# Patient Record
Sex: Female | Born: 1975 | Race: White | Hispanic: No | Marital: Married | Smoking: Never smoker
Health system: Southern US, Community
[De-identification: ages and names within clinical notes are randomized; demographics above are authoritative.]

## PROBLEM LIST (undated history)

## (undated) DIAGNOSIS — E782 Mixed hyperlipidemia: Secondary | ICD-10-CM

## (undated) DIAGNOSIS — K769 Liver disease, unspecified: Secondary | ICD-10-CM

## (undated) DIAGNOSIS — K5730 Diverticulosis of large intestine without perforation or abscess without bleeding: Secondary | ICD-10-CM

## (undated) DIAGNOSIS — Z9889 Other specified postprocedural states: Secondary | ICD-10-CM

## (undated) DIAGNOSIS — N390 Urinary tract infection, site not specified: Secondary | ICD-10-CM

## (undated) DIAGNOSIS — R011 Cardiac murmur, unspecified: Secondary | ICD-10-CM

## (undated) DIAGNOSIS — E063 Autoimmune thyroiditis: Secondary | ICD-10-CM

## (undated) DIAGNOSIS — G43909 Migraine, unspecified, not intractable, without status migrainosus: Secondary | ICD-10-CM

## (undated) DIAGNOSIS — M1990 Unspecified osteoarthritis, unspecified site: Secondary | ICD-10-CM

## (undated) DIAGNOSIS — T8859XA Other complications of anesthesia, initial encounter: Secondary | ICD-10-CM

## (undated) DIAGNOSIS — R112 Nausea with vomiting, unspecified: Secondary | ICD-10-CM

## (undated) HISTORY — DX: Migraine, unspecified, not intractable, without status migrainosus: G43.909

## (undated) HISTORY — DX: Autoimmune thyroiditis: E06.3

## (undated) HISTORY — DX: Diverticulosis of large intestine without perforation or abscess without bleeding: K57.30

## (undated) HISTORY — PX: APPENDECTOMY: SHX54

## (undated) HISTORY — PX: ABDOMINAL HYSTERECTOMY: SHX81

## (undated) HISTORY — DX: Mixed hyperlipidemia: E78.2

## (undated) HISTORY — DX: Urinary tract infection, site not specified: N39.0

## (undated) HISTORY — DX: Liver disease, unspecified: K76.9

---

## 2001-07-30 ENCOUNTER — Emergency Department (HOSPITAL_COMMUNITY): Admission: EM | Admit: 2001-07-30 | Discharge: 2001-07-30 | Payer: Self-pay | Admitting: Emergency Medicine

## 2002-06-01 ENCOUNTER — Other Ambulatory Visit: Admission: RE | Admit: 2002-06-01 | Discharge: 2002-06-01 | Payer: Self-pay | Admitting: Obstetrics and Gynecology

## 2002-12-14 ENCOUNTER — Other Ambulatory Visit: Admission: RE | Admit: 2002-12-14 | Discharge: 2002-12-14 | Payer: Self-pay | Admitting: Obstetrics and Gynecology

## 2003-05-04 ENCOUNTER — Inpatient Hospital Stay (HOSPITAL_COMMUNITY): Admission: AD | Admit: 2003-05-04 | Discharge: 2003-05-04 | Payer: Self-pay | Admitting: Obstetrics and Gynecology

## 2003-08-01 ENCOUNTER — Inpatient Hospital Stay (HOSPITAL_COMMUNITY): Admission: AD | Admit: 2003-08-01 | Discharge: 2003-08-01 | Payer: Self-pay | Admitting: Obstetrics and Gynecology

## 2003-08-29 ENCOUNTER — Encounter (INDEPENDENT_AMBULATORY_CARE_PROVIDER_SITE_OTHER): Payer: Self-pay | Admitting: *Deleted

## 2003-08-29 ENCOUNTER — Inpatient Hospital Stay (HOSPITAL_COMMUNITY): Admission: AD | Admit: 2003-08-29 | Discharge: 2003-09-06 | Payer: Self-pay | Admitting: Obstetrics and Gynecology

## 2003-08-29 ENCOUNTER — Inpatient Hospital Stay (HOSPITAL_COMMUNITY): Admission: AD | Admit: 2003-08-29 | Discharge: 2003-08-29 | Payer: Self-pay | Admitting: Obstetrics and Gynecology

## 2003-08-31 ENCOUNTER — Encounter: Payer: Self-pay | Admitting: Obstetrics and Gynecology

## 2003-09-07 ENCOUNTER — Encounter: Admission: RE | Admit: 2003-09-07 | Discharge: 2003-10-07 | Payer: Self-pay | Admitting: Obstetrics and Gynecology

## 2003-10-12 ENCOUNTER — Other Ambulatory Visit: Admission: RE | Admit: 2003-10-12 | Discharge: 2003-10-12 | Payer: Self-pay | Admitting: Obstetrics and Gynecology

## 2004-04-03 ENCOUNTER — Other Ambulatory Visit: Admission: RE | Admit: 2004-04-03 | Discharge: 2004-04-03 | Payer: Self-pay | Admitting: Obstetrics and Gynecology

## 2005-08-13 ENCOUNTER — Other Ambulatory Visit: Admission: RE | Admit: 2005-08-13 | Discharge: 2005-08-13 | Payer: Self-pay | Admitting: Obstetrics and Gynecology

## 2005-10-17 ENCOUNTER — Ambulatory Visit (HOSPITAL_COMMUNITY): Admission: RE | Admit: 2005-10-17 | Discharge: 2005-10-17 | Payer: Self-pay | Admitting: Obstetrics and Gynecology

## 2005-10-17 ENCOUNTER — Encounter (INDEPENDENT_AMBULATORY_CARE_PROVIDER_SITE_OTHER): Payer: Self-pay | Admitting: *Deleted

## 2007-12-06 ENCOUNTER — Emergency Department (HOSPITAL_COMMUNITY): Admission: EM | Admit: 2007-12-06 | Discharge: 2007-12-06 | Payer: Self-pay | Admitting: Emergency Medicine

## 2008-06-16 ENCOUNTER — Ambulatory Visit (HOSPITAL_COMMUNITY): Admission: RE | Admit: 2008-06-16 | Discharge: 2008-06-17 | Payer: Self-pay | Admitting: Obstetrics and Gynecology

## 2008-06-16 ENCOUNTER — Encounter (INDEPENDENT_AMBULATORY_CARE_PROVIDER_SITE_OTHER): Payer: Self-pay | Admitting: Obstetrics and Gynecology

## 2008-06-24 ENCOUNTER — Encounter (INDEPENDENT_AMBULATORY_CARE_PROVIDER_SITE_OTHER): Payer: Self-pay | Admitting: General Surgery

## 2008-06-24 ENCOUNTER — Encounter: Payer: Self-pay | Admitting: Obstetrics and Gynecology

## 2008-06-24 ENCOUNTER — Inpatient Hospital Stay (HOSPITAL_COMMUNITY): Admission: EM | Admit: 2008-06-24 | Discharge: 2008-06-28 | Payer: Self-pay | Admitting: Emergency Medicine

## 2008-06-25 ENCOUNTER — Other Ambulatory Visit: Payer: Self-pay | Admitting: Obstetrics and Gynecology

## 2008-07-13 ENCOUNTER — Inpatient Hospital Stay (HOSPITAL_COMMUNITY): Admission: EM | Admit: 2008-07-13 | Discharge: 2008-07-15 | Payer: Self-pay | Admitting: Emergency Medicine

## 2008-07-21 ENCOUNTER — Encounter: Admission: RE | Admit: 2008-07-21 | Discharge: 2008-07-21 | Payer: Self-pay | Admitting: General Surgery

## 2008-07-22 ENCOUNTER — Inpatient Hospital Stay (HOSPITAL_COMMUNITY): Admission: AD | Admit: 2008-07-22 | Discharge: 2008-07-30 | Payer: Self-pay | Admitting: General Surgery

## 2008-08-20 ENCOUNTER — Encounter: Admission: RE | Admit: 2008-08-20 | Discharge: 2008-08-20 | Payer: Self-pay | Admitting: General Surgery

## 2009-09-14 ENCOUNTER — Encounter: Admission: RE | Admit: 2009-09-14 | Discharge: 2009-09-14 | Payer: Self-pay | Admitting: Gastroenterology

## 2009-10-02 ENCOUNTER — Encounter: Admission: RE | Admit: 2009-10-02 | Discharge: 2009-10-02 | Payer: Self-pay | Admitting: Gastroenterology

## 2009-11-07 ENCOUNTER — Ambulatory Visit (HOSPITAL_COMMUNITY): Admission: RE | Admit: 2009-11-07 | Discharge: 2009-11-07 | Payer: Self-pay | Admitting: Obstetrics and Gynecology

## 2009-11-21 ENCOUNTER — Inpatient Hospital Stay (HOSPITAL_COMMUNITY): Admission: AD | Admit: 2009-11-21 | Discharge: 2009-11-21 | Payer: Self-pay | Admitting: Obstetrics and Gynecology

## 2009-12-27 ENCOUNTER — Ambulatory Visit (HOSPITAL_COMMUNITY): Admission: RE | Admit: 2009-12-27 | Discharge: 2009-12-27 | Payer: Self-pay | Admitting: Gastroenterology

## 2010-05-20 ENCOUNTER — Encounter: Payer: Self-pay | Admitting: General Surgery

## 2010-07-12 ENCOUNTER — Other Ambulatory Visit (HOSPITAL_COMMUNITY): Payer: Self-pay | Admitting: Anesthesiology

## 2010-07-12 ENCOUNTER — Encounter (HOSPITAL_COMMUNITY)
Admission: RE | Admit: 2010-07-12 | Discharge: 2010-07-12 | Disposition: A | Discharge status: Home / Self Care | Payer: 59 | Source: Ambulatory Visit | Attending: Obstetrics and Gynecology | Admitting: Obstetrics and Gynecology

## 2010-07-12 DIAGNOSIS — R102 Pelvic and perineal pain: Secondary | ICD-10-CM

## 2010-07-12 LAB — CBC
HCT: 41.3 % (ref 36.0–46.0)
MCH: 25.2 pg — ABNORMAL LOW (ref 26.0–34.0)
MCHC: 31.7 g/dL (ref 30.0–36.0)
RDW: 14.7 % (ref 11.5–15.5)

## 2010-07-12 LAB — SURGICAL PCR SCREEN: MRSA, PCR: NEGATIVE

## 2010-07-13 ENCOUNTER — Other Ambulatory Visit (HOSPITAL_COMMUNITY): Payer: Self-pay

## 2010-07-14 LAB — COMPREHENSIVE METABOLIC PANEL
BUN: 9 mg/dL (ref 6–23)
CO2: 24 mEq/L (ref 19–32)
Calcium: 9.3 mg/dL (ref 8.4–10.5)
Creatinine, Ser: 0.8 mg/dL (ref 0.4–1.2)
GFR calc non Af Amer: >60 mL/min (ref >60)
Glucose, Bld: 95 mg/dL (ref 70–99)
Sodium: 137 mEq/L (ref 135–145)
Total Protein: 7.2 g/dL (ref 6.0–8.3)

## 2010-07-14 LAB — CBC
HCT: 39.6 % (ref 36.0–46.0)
Hemoglobin: 13.3 g/dL (ref 12.0–15.0)
MCH: 26.4 pg (ref 26.0–34.0)
MCHC: 33.7 g/dL (ref 30.0–36.0)
MCV: 78.5 fL (ref 78.0–100.0)
RDW: 15.9 % — ABNORMAL HIGH (ref 11.5–15.5)

## 2010-07-15 LAB — CBC
HCT: 39.6 % (ref 36.0–46.0)
Hemoglobin: 13.5 g/dL (ref 12.0–15.0)
MCV: 77.6 fL — ABNORMAL LOW (ref 78.0–100.0)
RBC: 5.11 MIL/uL (ref 3.87–5.11)
RDW: 17.2 % — ABNORMAL HIGH (ref 11.5–15.5)
WBC: 6.5 10*3/uL (ref 4.0–10.5)

## 2010-07-15 LAB — SURGICAL PCR SCREEN: MRSA, PCR: NEGATIVE

## 2010-07-19 ENCOUNTER — Ambulatory Visit (HOSPITAL_COMMUNITY)
Admission: RE | Admit: 2010-07-19 | Discharge: 2010-07-19 | Disposition: A | Discharge status: Home / Self Care | Payer: 59 | Source: Ambulatory Visit | Attending: Anesthesiology | Admitting: Anesthesiology

## 2010-07-19 ENCOUNTER — Other Ambulatory Visit (HOSPITAL_COMMUNITY): Payer: Self-pay | Admitting: Anesthesiology

## 2010-07-19 DIAGNOSIS — R102 Pelvic and perineal pain: Secondary | ICD-10-CM

## 2010-07-19 DIAGNOSIS — Z01812 Encounter for preprocedural laboratory examination: Secondary | ICD-10-CM | POA: Insufficient documentation

## 2010-07-19 DIAGNOSIS — N949 Unspecified condition associated with female genital organs and menstrual cycle: Secondary | ICD-10-CM | POA: Insufficient documentation

## 2010-07-20 ENCOUNTER — Ambulatory Visit (HOSPITAL_COMMUNITY)
Admission: RE | Admit: 2010-07-20 | Discharge: 2010-07-20 | Disposition: A | Discharge status: Home / Self Care | Payer: 59 | Source: Ambulatory Visit | Attending: Obstetrics and Gynecology | Admitting: Obstetrics and Gynecology

## 2010-07-20 ENCOUNTER — Other Ambulatory Visit: Payer: Self-pay | Admitting: Obstetrics and Gynecology

## 2010-07-20 DIAGNOSIS — N949 Unspecified condition associated with female genital organs and menstrual cycle: Secondary | ICD-10-CM | POA: Insufficient documentation

## 2010-07-20 DIAGNOSIS — R1032 Left lower quadrant pain: Secondary | ICD-10-CM | POA: Insufficient documentation

## 2010-07-20 DIAGNOSIS — Z01818 Encounter for other preprocedural examination: Secondary | ICD-10-CM | POA: Insufficient documentation

## 2010-07-20 DIAGNOSIS — Z90710 Acquired absence of both cervix and uterus: Secondary | ICD-10-CM | POA: Insufficient documentation

## 2010-07-20 DIAGNOSIS — Z01812 Encounter for preprocedural laboratory examination: Secondary | ICD-10-CM | POA: Insufficient documentation

## 2010-07-20 LAB — COMPREHENSIVE METABOLIC PANEL
ALT: 19 U/L (ref 0–35)
AST: 19 U/L (ref 0–37)
Albumin: 3.9 g/dL (ref 3.5–5.2)
CO2: 23 mEq/L (ref 19–32)
Chloride: 106 mEq/L (ref 96–112)
Creatinine, Ser: 0.87 mg/dL (ref 0.4–1.2)
GFR calc Af Amer: >60 mL/min (ref >60)
GFR calc non Af Amer: >60 mL/min (ref >60)
Sodium: 136 mEq/L (ref 135–145)
Total Bilirubin: 0.6 mg/dL (ref 0.3–1.2)

## 2010-07-29 NOTE — Op Note (Addendum)
NAMEOliver Stout                 ACCOUNT NO.:  [id number]  MEDICAL RECORD NO.:  [id number]           PATIENT TYPE:  O  LOCATION:  XRAY                         FACILITY:  [facility]  PHYSICIAN:  Dineen Kid. Rana Snare, M.D.    DATE OF BIRTH:  02-24-1976  DATE OF PROCEDURE:  07/19/2010 DATE OF DISCHARGE:  07/19/2010                              OPERATIVE REPORT   PREOPERATIVE DIAGNOSES:  Pelvic pain, right lower quadrant pain, history pelvic adhesions.  POSTOPERATIVE DIAGNOSES:  Pelvic pain, right lower quadrant pain, history pelvic adhesions.  PROCEDURE:  Laparoscopic right salpingo-oophorectomy with lysis of adhesions.  SURGEON:  Dineen Kid. Rana Snare, MD  ANESTHESIA:  General endotracheal.  INDICATIONS:  [name] is a 35 year old with history of pelvic adhesions, pelvic pain, and has had hysterectomy in the past and has also had laparoscopic surgery for this with some improvement of symptoms.  They have recurred and worsened to point that it is incapacitating for her, requiring Vicodin to maintain adequate level pain control.  She does have a recurrent right ovarian cyst based on clinical symptoms and also ultrasound.  She desires surgical intervention and requested removal of the right tube and ovary with preservation of the left ovary.  She desires evaluation and treatment of pelvic adhesions.  The risks and benefits of the procedures were discussed at length which included but not limited to risk of infection, bleeding, damage to bowel, bladder, ureters, ovary, possibly it may not alleviate the pain, it could recur or worsen.  Risks associated with anesthesia blood transfusion.  She does give her informed consent and wished to proceed.  FINDINGS AT THE TIME OF SURGERY:  Normal-appearing liver, right ovary did have a simple appearing ovarian cyst, approximately 3-4 cm in size, left ovary was normal in appearance without evidence of adhesions or cyst.  There were adhesions from the  omentum and bowel to the cul-de-sac at the uterosacral ligament into the right pelvic sidewall from the descending colon, otherwise normal-appearing bladder and ureters.  The appendix was surgically absent.  DESCRIPTION OF PROCEDURE:  After adequate analgesia, the patient was placed in the dorsal lithotomy position.  She was sterilely prepped and draped.  The bladder was sterilely drained.  A sponge stick was placed intravaginal.  A 1-cm infraumbilical skin incision was made.  A Veress needle was inserted.  The abdomen was insufflated with dullness to percussion 11 mm trocar was inserted.  The above findings were noted by laparoscope.  A 5-mm trocar was inserted to the left of the midline two fingerbreadths above the pubic symphysis after careful and systematic evaluation of the abdomen and pelvis.  The right ovary was rasped, gyrus cutting forceps was used to coagulate and dissect across the infundibulopelvic ligament, removing the right tube and ovary from the pelvic sidewall with good hemostasis achieved.  The care was taken to avoid the underlying ureter.  The right ovary was morcellated and removed through the umbilical incision.  Evaluation of the pelvis filmy adhesions from the bowel to vaginal cuff to the descending colon were easily removed using the cutting forceps, descending colon adhesions to the right  pelvic wall were placed on counter traction and slowly dissected using cutting forceps.  Gyrus care taken to avoid the underlying bowel, underlying ureter, and achieved good hemostasis. After careful and meticulous dissection down the pelvic sidewall removing the epiploica from the sidewall, good hemostasis has been achieved.  No residual adhesions had been noted with normal-appearing anatomy restored.  Reevaluation of the ureters revealed good peristalsis, good hemostasis, and no evidence of any injury to the bowel or bladder.  After copious amount of irrigation, adequate  hemostasis was assured.  The Interceed adhesion barrier was placed in the cul-de-sac along the right pelvic sidewall and across the infundibulopelvic ligament.  The trocars were removed.  The abdomen was desufflated.  The infraumbilical skin incision was closed with a 0 Vicryl interrupted suture in the fascia, 3-0 Vicryl Rapide subcuticular suture, 5-mm site was closed with 3-0 Vicryl Rapide subcuticular suture and Dermabond adhesion barrier, incision was injected with 0.25% Marcaine a total of 10 mL used.  Sponge pack was removed from the vagina.  The patient was transferred to recovery room in a stable condition.  Sponge and instrument count was normal x3.  Estimated blood loss was minimal.  The patient received gentamicin and clindamycin preoperatively.  We will keep her in an extended care, and since she does have a history of postoperative slow recovery with the potential of keeping her overnight also we remove the PICC line which she had inserted yesterday before discharge home if she is discharged home from the recovery room or extended care, we will have her follow up in the next 2 to 3 weeks in the office and a prescription for Vicodin #30.  Told to return for increased pain, fever, or bleeding.     Dineen Kid Rana Snare, M.D.     DCL/MEDQ  D:  07/20/2010  T:  07/21/2010  Job:  [id number]  Electronically Signed by Candice Camp M.D. on 07/29/2010 11:31:34 AM

## 2010-08-08 LAB — CBC
HCT: 30.7 % — ABNORMAL LOW (ref 36.0–46.0)
Hemoglobin: 10.2 g/dL — ABNORMAL LOW (ref 12.0–15.0)
MCHC: 33.3 g/dL (ref 30.0–36.0)
Platelets: 338 10*3/uL (ref 150–400)
RDW: 16.4 % — ABNORMAL HIGH (ref 11.5–15.5)

## 2010-08-09 LAB — CLOSTRIDIUM DIFFICILE EIA
C difficile Toxins A+B, EIA: NEGATIVE
C difficile Toxins A+B, EIA: NEGATIVE

## 2010-08-09 LAB — ANAEROBIC CULTURE

## 2010-08-09 LAB — COMPREHENSIVE METABOLIC PANEL
ALT: 15 U/L (ref 0–35)
Alkaline Phosphatase: 69 U/L (ref 39–117)
BUN: 10 mg/dL (ref 6–23)
BUN: 7 mg/dL (ref 6–23)
CO2: 23 mEq/L (ref 19–32)
Calcium: 9 mg/dL (ref 8.4–10.5)
Chloride: 108 mEq/L (ref 96–112)
Chloride: 104 mEq/L (ref 96–112)
Creatinine, Ser: 0.86 mg/dL (ref 0.4–1.2)
GFR calc non Af Amer: >60 mL/min (ref >60)
Glucose, Bld: 90 mg/dL (ref 70–99)
Potassium: 4.3 mEq/L (ref 3.5–5.1)
Sodium: 138 mEq/L (ref 135–145)
Total Bilirubin: 0.5 mg/dL (ref 0.3–1.2)
Total Bilirubin: 0.6 mg/dL (ref 0.3–1.2)

## 2010-08-09 LAB — URINALYSIS, ROUTINE W REFLEX MICROSCOPIC
Glucose, UA: NEGATIVE mg/dL
Hgb urine dipstick: NEGATIVE
Nitrite: NEGATIVE
Protein, ur: NEGATIVE mg/dL
Specific Gravity, Urine: 1.025 (ref 1.005–1.030)
Urobilinogen, UA: 0.2 mg/dL (ref 0.0–1.0)
pH: 5.5 (ref 5.0–8.0)

## 2010-08-09 LAB — MAGNESIUM
Magnesium: 1.9 mg/dL (ref 1.5–2.5)
Magnesium: 1.9 mg/dL (ref 1.5–2.5)
Magnesium: 2 mg/dL (ref 1.5–2.5)

## 2010-08-09 LAB — CBC
HCT: 34.1 % — ABNORMAL LOW (ref 36.0–46.0)
Hemoglobin: 10.2 g/dL — ABNORMAL LOW (ref 12.0–15.0)
MCHC: 33.2 g/dL (ref 30.0–36.0)
MCHC: 33.3 g/dL (ref 30.0–36.0)
MCHC: 33.4 g/dL (ref 30.0–36.0)
MCV: 74.5 fL — ABNORMAL LOW (ref 78.0–100.0)
MCV: 73.2 fL — ABNORMAL LOW (ref 78.0–100.0)
MCV: 73.5 fL — ABNORMAL LOW (ref 78.0–100.0)
Platelets: 186 10*3/uL (ref 150–400)
Platelets: 209 10*3/uL (ref 150–400)
Platelets: ADEQUATE 10*3/uL (ref 150–400)
RBC: 4.22 MIL/uL (ref 3.87–5.11)
RDW: 15.7 % — ABNORMAL HIGH (ref 11.5–15.5)
RDW: 15.9 % — ABNORMAL HIGH (ref 11.5–15.5)
RDW: 15.9 % — ABNORMAL HIGH (ref 11.5–15.5)
WBC: 11.1 10*3/uL — ABNORMAL HIGH (ref 4.0–10.5)
WBC: 9.7 10*3/uL (ref 4.0–10.5)

## 2010-08-09 LAB — URINE CULTURE

## 2010-08-09 LAB — BASIC METABOLIC PANEL
BUN: 11 mg/dL (ref 6–23)
CO2: 27 mEq/L (ref 19–32)
Calcium: 8.5 mg/dL (ref 8.4–10.5)
Calcium: 8.9 mg/dL (ref 8.4–10.5)
Creatinine, Ser: 0.63 mg/dL (ref 0.4–1.2)
Creatinine, Ser: 0.87 mg/dL (ref 0.4–1.2)
GFR calc non Af Amer: >60 mL/min (ref >60)
Glucose, Bld: 101 mg/dL — ABNORMAL HIGH (ref 70–99)

## 2010-08-09 LAB — URINE MICROSCOPIC-ADD ON

## 2010-08-09 LAB — DIFFERENTIAL
Basophils Absolute: 0 10*3/uL (ref 0.0–0.1)
Eosinophils Absolute: 0.2 10*3/uL (ref 0.0–0.7)
Eosinophils Relative: 3 % (ref 0–5)
Eosinophils Relative: 1 % (ref 0–5)
Lymphocytes Relative: 25 % (ref 12–46)
Lymphocytes Relative: 10 % — ABNORMAL LOW (ref 12–46)
Lymphs Abs: 1.5 10*3/uL (ref 0.7–4.0)
Monocytes Absolute: 0.3 10*3/uL (ref 0.1–1.0)
Monocytes Relative: 6 % (ref 3–12)
Monocytes Relative: 3 % (ref 3–12)

## 2010-08-09 LAB — CULTURE, ROUTINE-ABSCESS: Culture: NO GROWTH

## 2010-08-09 LAB — LACTIC ACID, PLASMA: Lactic Acid, Venous: 1.2 mmol/L (ref 0.5–2.2)

## 2010-08-09 LAB — PHOSPHORUS
Phosphorus: 3.1 mg/dL (ref 2.3–4.6)
Phosphorus: 3.7 mg/dL (ref 2.3–4.6)
Phosphorus: 4.1 mg/dL (ref 2.3–4.6)

## 2010-08-09 LAB — LIPASE, BLOOD: Lipase: 50 U/L (ref 11–59)

## 2010-08-14 LAB — DIFFERENTIAL
Basophils Absolute: 0.1 10*3/uL (ref 0.0–0.1)
Basophils Absolute: 0 10*3/uL (ref 0.0–0.1)
Basophils Relative: 1 % (ref 0–1)
Basophils Relative: 0 % (ref 0–1)
Monocytes Absolute: 0.5 10*3/uL (ref 0.1–1.0)
Monocytes Relative: 8 % (ref 3–12)
Neutro Abs: 8.2 10*3/uL — ABNORMAL HIGH (ref 1.7–7.7)
Neutro Abs: 3.8 10*3/uL (ref 1.7–7.7)
Neutrophils Relative %: 84 % — ABNORMAL HIGH (ref 43–77)
Neutrophils Relative %: 62 % (ref 43–77)

## 2010-08-14 LAB — PREGNANCY, URINE: Preg Test, Ur: NEGATIVE

## 2010-08-14 LAB — URINALYSIS, ROUTINE W REFLEX MICROSCOPIC
Glucose, UA: NEGATIVE mg/dL
Hgb urine dipstick: NEGATIVE
Specific Gravity, Urine: 1.005 (ref 1.005–1.030)
pH: 6 (ref 5.0–8.0)

## 2010-08-14 LAB — COMPREHENSIVE METABOLIC PANEL
ALT: 20 U/L (ref 0–35)
AST: 22 U/L (ref 0–37)
CO2: 24 mEq/L (ref 19–32)
Calcium: 9.3 mg/dL (ref 8.4–10.5)
Chloride: 104 mEq/L (ref 96–112)
Creatinine, Ser: 0.77 mg/dL (ref 0.4–1.2)
GFR calc Af Amer: >60 mL/min (ref >60)
GFR calc non Af Amer: >60 mL/min (ref >60)
Glucose, Bld: 87 mg/dL (ref 70–99)
Total Bilirubin: 0.7 mg/dL (ref 0.3–1.2)

## 2010-08-14 LAB — POCT I-STAT, CHEM 8
Chloride: 104 mEq/L (ref 96–112)
Glucose, Bld: 93 mg/dL (ref 70–99)
HCT: 29 % — ABNORMAL LOW (ref 36.0–46.0)
Potassium: 3.7 mEq/L (ref 3.5–5.1)

## 2010-08-14 LAB — URINE CULTURE
Colony Count: NO GROWTH
Culture: NO GROWTH

## 2010-08-14 LAB — CBC
Hemoglobin: 9.2 g/dL — ABNORMAL LOW (ref 12.0–15.0)
Hemoglobin: 9.9 g/dL — ABNORMAL LOW (ref 12.0–15.0)
MCHC: 33.3 g/dL (ref 30.0–36.0)
MCHC: 34.1 g/dL (ref 30.0–36.0)
MCHC: 33 g/dL (ref 30.0–36.0)
Platelets: 331 10*3/uL (ref 150–400)
RBC: 3.82 MIL/uL — ABNORMAL LOW (ref 3.87–5.11)
RBC: 4.53 MIL/uL (ref 3.87–5.11)
RBC: 3.82 MIL/uL — ABNORMAL LOW (ref 3.87–5.11)
RDW: 14.5 % (ref 11.5–15.5)
RDW: 15.1 % (ref 11.5–15.5)
WBC: 8.1 10*3/uL (ref 4.0–10.5)

## 2010-08-14 LAB — GENTAMICIN LEVEL, RANDOM: Gentamicin Rm: 1.8 ug/mL

## 2010-09-11 NOTE — Discharge Summary (Signed)
NAMEOliver Barre                 ACCOUNT NO.:  [id number]   MEDICAL RECORD NO.:  [id number]          PATIENT TYPE:  INP   LOCATION:  9309                          FACILITY:  [facility]   PHYSICIAN:  Dineen Kid. Rana Snare, M.D.    DATE OF BIRTH:  29-Jul-1975   DATE OF ADMISSION:  06/25/2008  DATE OF DISCHARGE:  06/28/2008                               DISCHARGE SUMMARY   HISTORY OF PRESENT ILLNESS:  [name] 1 week  status post laparoscopic-assisted vaginal hysterectomy by Dr. Candice Camp  for fibroids and dysfunctional bleeding.  The procedure was  uncomplicated, and the patient had an unremarkable course after the  hysterectomy.  Until 1 week later, the patient began having fevers and  pain in the right lower quadrant, presented to the emergency room  underwent a CAT scan showing an inflamed appendix.  The patient was  taken to the operating room by Dr. Almond Lint, general surgeon, for a  laparoscopic appendectomy with diagnostic laparoscopy.  Dr. Richarda Overlie was called for intraoperative consult for evaluation of the cul-  de-sac and cuff.  He apparently felt that the cuff was clear with only  old clot and no actual bleeding, and the tubes and ovaries looked  normal.  Cultures had been obtained, but per Dr. Donell Beers and Dr. Marcelle Overlie  they agreed to admit to Dr. Dennie Bible service for postoperative  antibiotics for presumed cuff cellulitis following appendectomy.   HOSPITAL COURSE:  The patient's postoperative care was relatively  unremarkable.  She did have good pain control, remained afebrile, and  had good return of bowel function.  Her only fever was the first 24  hours postoperatively.  By postoperative day #3, after continuing on IV  antibiotics which were clindamycin __________ tolerating regular diet,  ambulating without difficulty, had a postoperative hemoglobin 9.2 with a  white count which was normal at 9.8.  The patient was discharged and we  had switched  her to oral antibiotics which included Flagyl and  ciprofloxacin.  The incisions were clean, dry, and intact.  There was  normoactive bowel sounds.   DISPOSITION:  The patient was discharged home to follow up in the office  in 1 week.  Continue on Flagyl 500 mg p.o. t.i.d., ciprofloxacin 500 mg  p.o. b.i.d.  Return for increased pain, fever, or bleeding.  She also  has prescription for oxycodone taken as needed.       Dineen Kid Rana Snare, M.D.  Electronically Signed     DCL/MEDQ  D:  08/08/2008  T:  08/08/2008  Job:  [id number]

## 2010-09-11 NOTE — Discharge Summary (Signed)
NAMEOliver Stout                 ACCOUNT NO.:  [id number]   MEDICAL RECORD NO.:  [id number]          PATIENT TYPE:  OIB   LOCATION:  9310                          FACILITY:  [facility]   PHYSICIAN:  Dineen Kid. Rana Snare, M.D.    DATE OF BIRTH:  [birth year]   DATE OF ADMISSION:  06/16/2008  DATE OF DISCHARGE:  06/17/2008                               DISCHARGE SUMMARY   HISTORY OF PRESENT ILLNESS:  [name] is a 35 year old with worsening  pelvic pain, abnormal bleeding fibroids, and recurrent endometrial  polyps.  She desires definitive surgical intervention, requests  hysterectomy, has no further childbearing desires, presents for this.   HOSPITAL COURSE:  The patient underwent a laparoscopically-assisted  vaginal hysterectomy with lysis of adhesions from omental adhesions.  The findings were enlarged uterus with fibroids.  The surgery was  uncomplicated with estimated blood loss of 300 mL.  Postoperative care  was unremarkable with good return of ambulation and bowel sounds.  By  postop day #1, she was tolerating a regular diet, ambulating without  difficulty, and have regular active bowel sounds.  Incision was clean,  dry, and intact.  Her postoperative hemoglobin was 9.9 and desires  discharge home.   DISPOSITION:  The patient to be discharged home and will follow up in  the office in 2-3 weeks.  Given the routine instruction sheet for  hysterectomy.  Told to return for increased pain, fever, or bleeding.  She is on light activity.  She has a prescription for Tylox #30 and  Motrin as needed.      Dineen Kid Rana Snare, M.D.  Electronically Signed     DCL/MEDQ  D:  06/17/2008  T:  06/17/2008  Job:  [id number]

## 2010-09-11 NOTE — H&P (Signed)
NAMEOliver Stout                 ACCOUNT NO.:  [id number]   MEDICAL RECORD NO.:  [id number]          PATIENT TYPE:  INP   LOCATION:  5149                         FACILITY:  [facility]   PHYSICIAN:  Almond Lint, MD       DATE OF BIRTH:  11-08-1975   DATE OF ADMISSION:  07/22/2008  DATE OF DISCHARGE:                              HISTORY & PHYSICAL   CHIEF COMPLAINT:  Abdominal pain.   HISTORY OF PRESENT ILLNESS:  [name] is an unfortunate 35 year old  female who underwent laparoscopic vaginal hysterectomy back in February  of 2010 by Dr. Dennie Bible practice.  Approximately 2 weeks later for  continued unresolved symptoms, she underwent CT scan, which was  concerning for appendicitis.  She had a laparoscopic appendectomy in  June 24, 2008, with resulting mildly inflamed appendicitis and what  appeared to be vaginal cuff cellulitis and weird infection of the right  salpinx.  She underwent IV antibiotic treatment for several days, and  then was discharged home on oral antibiotics.  She re-presented several  days after stopping her antibiotics with a pelvic abscess.  Percutaneous  drain was placed and she presented yesterday for CT scan.  At that  point, her fluid collections were appearing improved with 3 collections  all communicating with each other and with the drain.  She awoke this  morning at 4 a.m. with horrible abdominal pain.  She has been having a  little diarrhea yesterday following the CT scan contrast, but today  started having crampy diarrhea.  She also had nausea and vomiting and  dry heaving.  She denies fevers and chills.   PAST MEDICAL HISTORY:  Positive for migraines.   PAST SURGICAL HISTORY:  C-section in 2005, D&C in 2007, hysterectomy,  and appendectomy as described above.   MEDICATIONS:  Propoxyphene, Flagyl, Percocet, Phenergan, and Zomig.   ALLERGIES:  PENICILLIN.   FAMILY HISTORY:  Father has diabetes, heart disease, high blood  pressure, and CVA.   Mother has multiple sclerosis.   SOCIAL HISTORY:  She is unemployed, accompanied by her husband.  She  does not smoke and drinks approximately 3 alcoholic beverages a month.   REVIEW OF SYSTEMS:  Otherwise, negative except for the HPI x11 systems.   PHYSICAL EXAMINATION:  GENERAL:  She is alert and oriented, in obvious  distress.  VITAL SIGNS:  Temperature was 98.1, pulse 100, blood pressure 118/80,  respiratory rate 20.  HEENT:  Normocephalic, atraumatic.  HEART:  Regular.  ABDOMEN:  Soft and nondistended with diffuse tenderness across the lower  abdomen.  EXTREMITIES:  Warm and well perfused.   CT scan images and report were reviewed and described above.   ASSESSMENT:  [name] around a month out from  an appendectomy and around 2 weeks out from percutaneous drain placement  for abscess.  We will admit her to the hospital with IV antibiotics,  fluid resuscitation, and labs.  We will rule out Clostridium difficile  given her oddly appearing diarrhea and crampy abdominal pain.  Also, we  will obtain CT scan to evaluate  for superior mesenteric vein thrombosis.  If that is the case, we will need to anticoagulate her.  I placed her on  tigecycline given her allergy to penicillin.      Almond Lint, MD  Electronically Signed     FB/MEDQ  D:  07/22/2008  T:  07/23/2008  Job:  [id number]

## 2010-09-11 NOTE — Op Note (Signed)
NAMEOliver Stout                 ACCOUNT NO.:  [id number]   MEDICAL RECORD NO.:  [id number]          PATIENT TYPE:  INP   LOCATION:  5149                         FACILITY:  [facility]   PHYSICIAN:  Danise Edge, M.D.   DATE OF BIRTH:  Jul 09, 1975   DATE OF PROCEDURE:  07/26/2008  DATE OF DISCHARGE:                               OPERATIVE REPORT   PROCEDURE:  Diagnostic flexible proctosigmoidoscopy.   HISTORY:  [name], admitted to [facility] to evaluate and treat intermittent sharp generalized  abdominal pain associated with bowel urgency and small-volume nonbloody  diarrhea.   On June 24, 2008, the patient underwent a laparoscopic appendectomy.  On July 14, 2008, she required placement of drainage catheters into a  right lower quadrant abdominal abscess radiologically.  The patient has  been on Cipro and Flagyl since around July 14, 2008.  On July 22, 2008, MRI of the abdomen showed enlarging abdominal fluid collections in  the right lower quadrant despite drainage catheters.   C. diff toxin screen for stool on 2 occasions have been negative.   MEDICATIONS ALLERGIES:  PENICILLIN.   CURRENT MEDICATION:  Cipro, Flagyl, Tylenol, lorazepam, oxycodone, and  Imitrex.   PAST MEDICAL HISTORY:  1. June 16, 2008, laparoscopic-assisted vaginal hysterectomy with      adhesion lysis.  2. June 24, 2008, laparoscopic appendectomy with diagnostic      laparoscopy.  3. Migraine headache syndrome.  4. D and C in 2007.  5. Cesarean section in 2000.  6. July 14, 2008, right lower quadrant abdominal abscess, drained      percutaneously by Interventional Radiology.   HABITS:  The patient does not smoke cigarettes and consumes alcohol in  moderation.   ENDOSCOPIST:  Danise Edge, MD   PREMEDICATIONS:  1. Fentanyl 50 mcg.  2. Versed 10 mg.   PROCEDURE:  After obtaining informed consent, Ms. Church was placed in  the left lateral  decubitus position.  I administered intravenous  fentanyl and intravenous Versed to achieve conscious sedation for the  procedure.  The patient's blood pressure, oxygen saturation, and cardiac  rhythm were monitored throughout the procedure and documented in the  medical record.   Anal inspection and digital rectal exam were normal.  The Pentax  gastroscope was introduced into the rectum and advanced to approximately  55 cm from the anal verge without difficulty.  The patient received a  Fleet Enema prep prior to the exam, and I got a good look at the distal  colonic mucosa.   Endoscopic appearance of the rectum and distal left colon appeared  normal.  There was no endoscopic evidence for the presence of C.  difficile colitis or inflammatory bowel disease.   ASSESSMENT:  Normal flexible proctocolonoscopy to 55 cm from the anal  verge.   RECOMMENDATIONS:  Proceed with upper GI, small bowel follow-through x-  ray series looking for signs of inflammatory bowel disease.           ______________________________  Danise Edge, M.D.     MJ/MEDQ  D:  07/26/2008  T:  07/27/2008  Job:  [id number]

## 2010-09-11 NOTE — H&P (Signed)
NAMEOliver Stout                 ACCOUNT NO.:  [id number]   MEDICAL RECORD NO.:  [id number]          PATIENT TYPE:  OBV   LOCATION:  0098                         [facility]AC[facility]   PHYSICIAN:  Almond Lint, MD       DATE OF BIRTH:  01-May-1975   DATE OF ADMISSION:  06/24/2008  DATE OF DISCHARGE:  06/25/2008                              HISTORY & PHYSICAL   CHIEF COMPLAINT:  Abdominal pain.   HISTORY OF PRESENT ILLNESS:  [name] who  underwent a laparoscopic-assisted vaginal hysterectomy last week with  Dr. Rana Snare.  This was performed for dysfunctional uterine bleeding and  fibroids.  Since the surgery, she states that she has never really felt  well.  She has had continued pain and some nausea.  She went to the  physician on call 4 days ago and received oral antibiotics for fevers  and chills.  Despite that, she has continued to feel poorly and had  worsening of her abdominal pain over the last 24 hours with it landing  in the right lower quadrant.  She does not have much of an appetite.  She saw Dr. Marcelle Overlie today, and he performed a CT scan for her continued  pain, and they were concerned about appendicitis based on the scan.  There is no evidence of any abscess.  She currently is not having any  fevers today.   PAST MEDICAL HISTORY:  Significant for migraines.   PAST SURGICAL HISTORY:  Significant for the hysterectomy last Thursday,  a D and C and hysteroscopy back in 2007, a C-section in 2000.   SOCIAL HISTORY:  She is accompanied by her sister-in-law and her husband  who is a IT sales professional, and her sister-in-law is a Facilities manager at the OR  at [employer].  She does not smoke and uses alcohol approximately  3 times per month.   FAMILY HISTORY:  Noncontributory.   MEDICATIONS:  She is on Zomig, Percocet, ciprofloxacin and Motrin.   ALLERGIES:  She has tongue swelling and facial edema with PENICILLIN.   REVIEW OF SYSTEMS:  Otherwise negative  x11 systems.   PHYSICAL EXAMINATION:  VITAL SIGNS:  Temperature 98.2, pulse 109, blood  pressure 117/81, respiratory rate 16, and 99% on room air.  GENERAL:  Alert and oriented x3, in no acute distress.  MENTAL STATUS:  Mood and affect are normal.  SKIN:  The patient is slightly pale.  HEART:  Tachycardic but regular.  LUNGS:  Clear to auscultation bilaterally.  ABDOMEN:  Soft, nondistended, focally tender in the suprapubic and right  lower quadrant regions.  EXTREMITIES:  Warm and well perfused.  No pitting edema.   LABORATORIES:  White count of 6.1, hemoglobin and hematocrit 9.7 and  29.3 respectively, platelet count 350,000.  Chemistries:  Potassium is  3.7, creatinine 0.8.  CT scan from earlier today demonstrating  inflammatory changes in the pelvis with no focal abscesses.   ASSESSMENT:  [name] 1 week status post  laparoscopic-assisted vaginal hysterectomy with right lower quadrant  abdominal pain.  Given  the localization of her tenderness and the  suggestion of appendiceal thickening on CT scan, we will take her to the  operating room for diagnostic laparoscopy and appendectomy.  If anything  appears abnormal with regard to her hysterectomy, we will call Dr.  Marcelle Overlie who is on call tonight for Dr. Rana Snare.  Should there be an  abscess, we will drain it.  We will put her on IV antibiotics and keep  her n.p.o. to go to the operating room.      Almond Lint, MD  Electronically Signed     FB/MEDQ  D:  06/24/2008  T:  06/25/2008  Job:  [id number]

## 2010-09-11 NOTE — Op Note (Signed)
NAMEOliver Stout                 ACCOUNT NO.:  [id number]   MEDICAL RECORD NO.:  [id number]          PATIENT TYPE:  OIB   LOCATION:  9310                          FACILITY:  [facility]   PHYSICIAN:  Dineen Kid. Rana Snare, M.D.    DATE OF BIRTH:  1975-08-27   DATE OF PROCEDURE:  06/16/2008  DATE OF DISCHARGE:                               OPERATIVE REPORT   PREOPERATIVE DIAGNOSES:  1. Menorrhagia.  2. Fibroids.  3. Pelvic pain.  4. Dyspareunia.   POSTOPERATIVE DIAGNOSES:  1. Menorrhagia.  2. Fibroids.  3. Pelvic pain.  4. Dyspareunia.  5. Pelvic adhesions.   PROCEDURES:  Laparoscopic-assisted vaginal hysterectomy and lysis of  adhesions.   SURGEON:  Dineen Kid. Rana Snare, MD   ASSISTANT:  Stann Mainland. Vincente Poli, MD   ANESTHESIA:  General endotracheal.   INDICATIONS:  [name] is a 35 year old G1, P1.  No further  childbearing desires.  She has had abnormal uterine bleeding, fibroids,  recurrent endometrial polyps.  She also continued to have pelvic pain  and dyspareunia over the last number of years.  She desires definitive  surgical intervention and requests hysterectomy, presents for this.  Risks and benefits of the procedure were discussed at length, which  include, but not limited to risk of infection, bleeding, damage to  bowel, bladder, ureters, ovaries, possibly this may not alleviate the  pain, it could recur or worsen risks associated with blood transfusion,  and anesthesia were discussed.  Informed consent was obtained.   FINDINGS:  Findings at the time of the surgery were adhesions from the  omentum to anterior abdominal wall and also bladder to the uterus,  enlarged uterus with fibroids, normal-appearing ovaries, appendix, and  liver.   DESCRIPTION OF PROCEDURE:  After adequate analgesia, the patient was  placed in the dorsal lithotomy position.  She was sterilely prepped and  draped.  The bladder sterilely drained with Foley catheter.  A Hulka  tenaculum was placed on the  cervix.  A 1-cm infraumbilical skin incision  was made.  A Veress needle was inserted.  The abdomen was insufflated,  dullness to percussion, 11-mm trocar was inserted.  The above findings  were noted.  A 5-mm trocar was inserted to the left of the midline 2  fingerbreadths above pubic symphysis.  Gyrus cutting forceps was used to  grasp and coagulate and dissect the omentum from the anterior abdominal  wall with good hemostasis achieved.  The uterus was then elevated.  The  right utero-ovarian ligament was identified, grasped, ligated, and  dissected across the utero-ovarian ligaments.  The right fallopian tube  and also the right round ligament to the left round ligament was  identified, coagulated, and dissected.  This was done similarly across  the left utero-ovarian ligament down to the inferior portion of the  broad ligament.  Good hemostasis was achieved.  The bladder was then  elevated, small incision was made at the uterovesical junction freeing  up the bladder from the uterus.  The abdomen was then desufflated.  Legs  repositioned, weighted speculum placed in vagina, posterior colpotomy  was  performed.  Cervix was then circumscribed with Bovie cautery.  LigaSure instrument was used to ligate across the uterosacral ligaments  bilaterally, bladder pillars, and cardinal ligaments bilaterally  dissected with Mayo scissors.  The anterior vaginal mucosa was dissected  off the cervix.  The anterior peritoneum was entered sharply.  Deaver  retractor placed underneath the bladder.  The uterine vasculature was  then ligated with LigaSure and dissected with Mayo scissors.  The  inferior portion of the broad ligament was then ligated and dissected.  The fundus of the uterus was grasped with a thyroid tenaculum and  delivered through the introitus.  The uterosacral ligaments were  identified and grasped with Allis clamps.  Sutures of 0-Monocryl suture  placed in a figure-of-eight fashion.   The posterior vaginal mucosa was  then closed in purse-string fashion with 0-Monocryl suture.  Vagina was  then closed with figure-of-eights of 0-Monocryl suture in a vertical  fashion with good approximation, good hemostasis noted.  Foley catheter  was placed with returning clear yellow urine.  The legs repositioned,  abdomen reinsufflated.  A Dejot  suction irrigator was used to irrigate  the abdomen and pelvis.  Careful systematic evaluation of the pedicles  with bipolar used to touch-up small peritoneal bleeders and re-grasp  several small pedicles achieving good hemostasis and care taken to avoid  the underlying ureter and bowel after good hemostasis had been achieved  and copious amount of irrigation, adequate hemostasis was assured, the  abdomen was then desufflated, the trocars removed.  The infraumbilical  skin incision was closed with 0-Vicryl interrupted suture in the fascia,  3-0 Vicryl Rapide subcuticular suture.  The 5-mm site was closed with 3-  0 Vicryl Rapide subcuticular suture and Dermabond.  The incisions were  injected with 0.25% Marcaine, total 10 mL used.  The patient received  clindamycin 900 mg and Cipro 400 mg preoperatively.  The patient was  stable and transferred to recovery.  Instrument and sponge counts normal  x3.   ESTIMATED BLOOD LOSS:  300 mL.      Dineen Kid Rana Snare, M.D.  Electronically Signed     DCL/MEDQ  D:  06/16/2008  T:  06/16/2008  Job:  [id number]

## 2010-09-11 NOTE — Op Note (Signed)
NAMEOliver Stout                 ACCOUNT NO.:  [id number]   MEDICAL RECORD NO.:  [id number]          PATIENT TYPE:  OBV   LOCATION:  0098                         [facility]ACILITY[facility]  [facility]   PHYSICIAN:  Almond Lint, MD       DATE OF BIRTH:  [birth year]   DATE OF PROCEDURE:  06/24/2008  DATE OF DISCHARGE:  06/25/2008                               OPERATIVE REPORT   PREOPERATIVE DIAGNOSIS:  Appendicitis.   POSTOPERATIVE DIAGNOSES:  1. Appendicitis.  2. Infected vaginal cuff and stump of the bilateral fallopian tubes.   PROCEDURE:  Laparoscopic appendectomy and diagnostic laparoscopy.   SURGEON:  Almond Lint, MD.   Robin Stout. Robin Stout, M.D. for the diagnostic laparoscopy.   ANESTHESIA:  General and local.   FINDINGS:  Appendix secondarily inflamed lying on top of normal-  appearing ovary, but inflamed-appearing fallopian tube and necrotic-  appearing tube stump and vaginal cuff.   SPECIMEN:  Appendix to pathology.   ESTIMATED BLOOD LOSS:  Minimal.   COMPLICATIONS:  None known.   PROCEDURE:  [name] 1 week status post LAVH  for fibroids and dysfunctional uterine bleeding.  She presented with a  week of pain and not quite feeling well since her surgery.  She had  fevers and eventually ended up getting a CT scan showing an inflamed  appendix.  She was taken to the operating room and placed supine on the  operating room table.  Her Foley was placed and arms were tucked.  Her  abdomen was prepped and draped in a sterile fashion.  Time-out was  performed according to the surgical safety checklist.  When all was  correct, we continued.  The infraumbilical skin was anesthetized with  local anesthesia and the prior incision was opened.  The fascia was  entered at the site of her fascial incision.  A pursestring Vicryl  suture was placed around this and the Hasson trocar was introduced into  the abdomen.  The pneumoperitoneum was achieved to a pressure  of 15  mmHg.  The abdomen was examined with the camera and 2 other ports were  placed, one in the left lower quadrant and one in the right upper  quadrant.  This was done under direct visualization.  The omentum was  retracted from the pelvis and it was seen that the appendix was slightly  inflamed lying on top of an inflamed-appearing ovary and tube.  The  adjacent sigmoid also appeared to be slightly secondarily inflamed.  The  vaginal cuff and the proximal ends of the tubes appeared to be slightly  necrotic and inflamed.  They had a whitish appearance and were slightly  gangrenous.  The appendix was only mildly inflamed.  However, this was  removed by taking the mesoappendix with the Harmonic and coming across  the base of the appendix with the Endo-GIA.  The appendix was then  placed into the EndoCatch bag and removed from the umbilical incision.  The umbilical fascia was dilated with a Tresa Endo in order to achieve this.  Dr. Marcelle Stout then came in  and also worked with [place].  The appendiceal stump  staple line was examined for bleeding and none was seen.  He evaluated  the pelvis as well and felt that this was most likely a result of some  sort of mucosal pelvic infection, something similar to PID that was  causing this type of inflammation.  We irrigated the area and freed up  some of these adhesions.   The two 5 mm ports were removed with no bleeding seen.  The  pneumoperitoneum was allowed to evacuate and the Hasson was removed.  The pursestring suture was closed and the fascia was closed with a  suture.  The skin of all the incisions was then closed using 4-0  Monocryl in a subcuticular fashion.  Skin was cleaned, dried and dressed  with Dermabond.  The patient was awakened from anesthesia and taken to  the PACU in stable condition.      Almond Lint, MD  Electronically Signed     FB/MEDQ  D:  06/24/2008  T:  06/25/2008  Job:  [id number]

## 2010-09-11 NOTE — Consult Note (Signed)
NAMEOliver Barre                 ACCOUNT NO.:  [id number]   MEDICAL RECORD NO.:  [id number]         PATIENT TYPE:  WINP   LOCATION:                                FACILITY:  [facility]   PHYSICIAN:  [name] Marcelle Overlie, M.D.DATE OF BIRTH:  Jan 26, 1976   DATE OF CONSULTATION:  06/24/2008  DATE OF DISCHARGE:  06/25/2008                                 CONSULTATION   PREOPERATIVE CONSULTATION   PREOPERATIVE DIAGNOSIS:  Pelvic pain, rule out appendicitis.   POSTOPERATIVE DIAGNOSIS:  ____Same, PID.   INTRAOPERATIVE COMPLICATIONS:  None.   Patient is one week postoperative  _LAVH_, who has experienced some  fever and pelvic pain and was evaluated by abdominopelvic CT earlier  today with changes concerning for possible appendicitis.  Dr. Donell Beers  took her to the operating room for a laparoscopic appendectomy.  I am  requested for a consult due to pelvic inflammation.   On examination, there was a 3-port laparoscopy.  The appendix had  already been removed.  There was no evidence of frank pus.  There was  edema and erythema surrounding both tubes and ovaries, the cuff looked  relatively clear except for minimal old blood clot and no actual  bleeding.  The tissue on both sides, namely both tubes and ovaries,  looked viable.  At that point, we recommended postoperative antibiotics  treating for blood clot cellulitis/PID.  Cultures had been obtained.  Since she was PENICILLIN ALLERGIC, she was started on gentamicin and  Cleocin IV.      Richard M. Marcelle Overlie, M.D.  Electronically Signed     RMH/MEDQ  D:  06/24/2008  T:  06/25/2008  Job:  [id number]

## 2010-09-11 NOTE — H&P (Signed)
NAMEOliver Barre                 ACCOUNT NO.:  [id number]   MEDICAL RECORD NO.:  [id number]          PATIENT TYPE:  AMB   LOCATION:  SDC                           FACILITY:  [facility]   PHYSICIAN:  Dineen Kid. Rana Snare, M.D.    DATE OF BIRTH:  1975-04-30   DATE OF ADMISSION:  06/16/2008  DATE OF DISCHARGE:                              HISTORY & PHYSICAL   HISTORY OF PRESENT ILLNESS:  [name] is a 35 year old G1, P1 with  abnormal uterine bleeding, fibroids and recurrent endometrial polyps.  She has also had __________ pelvic pain and dyspareunia for the last  number of years.  She desires definitive surgical intervention, has no  further childbearing desires and desires hysterectomy.   PAST MEDICAL HISTORY:  Significant for history of migraines.   PAST SURGICAL HISTORY:  She has had a previous cesarean section and also  hysteroscopy and D and C for endometrial polyps.   MEDICATIONS:  Currently on NuvaRing.   ALLERGIES:  She has an allergy to PENICILLIN.   PHYSICAL EXAMINATION:  VITAL SIGNS:  Blood pressure is 124/92, weight  243 pounds.  HEART:  Regular rate and rhythm.  LUNGS:  Clear to auscultation bilaterally.  ABDOMEN:  Nondistended, nontender.  PELVIC:  The uterus is anteverted, mobile and nontender.   Saline infusion ultrasound shows several small fibroids, two intramural,  and several subserosal fibroids, largest 2.6 cm in size.  There are no  intracavitary masses.  There are multiple follicles in each ovary.   IMPRESSION AND PLAN:  Menorrhagia, fibroids, pelvic pain and dyspareunia  with no further childbearing desires.   PLAN:  Laparoscopic-assisted vaginal hysterectomy, possible total  abdominal hysterectomy.  Discussed the procedures at length which  included risks and benefits which include but are not limited to risk of  infection, bleeding, damage to the bowel, bladder, the ureters, the  ovaries.  Possibly, this may not alleviate the pain.  It could recur  worsening.   Risks associated with anesthesia, risk associated with blood  transfusion.  We discussed the possibility of having to convert to total  abdominal hysterectomy.  She does wish preservation of the ovaries.  She  does give her informed consent and wished to proceed.      Dineen Kid Rana Snare, M.D.  Electronically Signed    DCL/MEDQ  D:  06/15/2008  T:  06/15/2008  Job:  [id number]

## 2010-09-11 NOTE — H&P (Signed)
NAMEOliver Stout                 ACCOUNT NO.:  [id number]   MEDICAL RECORD NO.:  [id number]          PATIENT TYPE:  INP   LOCATION:  1524                         FACILITY:  [facility]   PHYSICIAN:  Sharlet Salina T. Hoxworth, M.D.DATE OF BIRTH:  06/24/1975   DATE OF ADMISSION:  07/13/2008  DATE OF DISCHARGE:                              HISTORY & PHYSICAL   CHIEF COMPLAINT:  Right-sided abdominal pain.   HISTORY OF PRESENT ILLNESS:  [name] who is  status post laparoscopic appendectomy by Dr. Donell Beers on June 24, 2008.  This was 1 week following a laparoscopic assisted vaginal  hysterectomy for dysfunctional uterine bleeding.  Description of the  operative findings questioned whether this was actually acute  appendicitis or secondarily inflamed due to infection or inflammatory  changes post hysterectomy.  Final pathology did reportedly show acute  appendicitis.  At any rate, she was hospitalized for about 3-4 days  after appendectomy then discharged home.  She states she was doing well  and gradually feeling better until about 4 days ago.  At that time, she  noticed the onset of constant aching right lower and right-sided  abdominal pain.  This pain has been gradually worsening and constant  ever since.  Has gotten to the point where it is not tolerable.  She saw  her gynecologist today and was sent to [facility] Emergency Room for  further evaluation.  CT scan was obtained as below.  The pain is  somewhat worse with motion.  She has not had any fever or chills.  She  has had a slight amount of vaginal discharge as she has had since her  hysterectomy but nothing unusual.  No urinary burning, frequency.  She  has had some mild nausea but no vomiting.   PAST MEDICAL HISTORY:  Other surgery includes C-section and  hysteroscopy.  She is treated for migraine headaches.   CURRENT MEDICATIONS:  1. Zomig.  2. Cipro.  3. Motrin.   ALLERGIES:  PENICILLIN.   SOCIAL HISTORY:  Married.  No cigarettes.  Occasional alcohol.   FAMILY HISTORY:  Noncontributory.   REVIEW OF SYSTEMS:  All generally unremarkable except as above.   PHYSICAL EXAM:  GENERAL:  Temperature is 98.2, heart rate is 101,  respirations 18, blood pressure 130/79, O2 sats 100.  GENERAL:  She is a mildly obese white female who appears uncomfortable  but in no distress.  SKIN:  Warm and dry.  No rash or infection.  HEENT: Sclera nonicteric.  Oropharynx clear.  LUNGS:  Clear without wheezing or increased work of breathing.  CARDIAC:  Regular with mild tachycardia.  No murmurs.  No edema.  ABDOMEN:  Well-healed laparoscopic incisions.  Abdomen is soft with some  moderate right lower quadrant and right mid lateral abdominal  tenderness.  There is no guarding, masses or peritoneal signs.  PELVIC: Exam not repeated.  EXTREMITIES: No joint swelling, deformity, edema.  NEUROLOGIC: Alert, fully oriented.   LABORATORY:  White count 6.2 without left shift.  Hemoglobin 10.6.  Electrolytes:  LFTs normal.  Urinalysis shows positive  leukocyte  esterase, too numerous to count white cells and too numerous to count  red cells.   CT scan of the abdomen and pelvis reviewed.  This shows a just over 4 cm  rounded fluid collection with a slightly enhancing rim in the right  lower quadrant, cannot rule out abscess.   ASSESSMENT/PLAN:  Abdominal pain 2 weeks status post laparoscopic  appendectomy and 3 weeks status post vaginal hysterectomy.  CT scan  indicates a possible early abscess.  She appeared to have a urinary  tract infection as well by urinalysis but no urinary symptoms.   The patient's pain is not tolerable at home.  She will be admitted.  Will obtain urine C and S.  Will switch her to IV Tygacil.  Interventional Radiology is be consulted regarding possible aspiration  or drainage of her right pelvic fluid collection.      Lorne Skeens. Hoxworth, M.D.  Electronically  Signed     BTH/MEDQ  D:  07/13/2008  T:  07/14/2008  Job:  [id number]

## 2010-09-11 NOTE — Consult Note (Signed)
NAMEOliver Stout                 ACCOUNT NO.:  [id number]   MEDICAL RECORD NO.:  [id number]          PATIENT TYPE:  INP   LOCATION:  5149                         FACILITY:  [facility]   PHYSICIAN:  Almond Lint, MD       DATE OF BIRTH:  [birth year]   DATE OF CONSULTATION:  07/29/2008  DATE OF DISCHARGE:                                 CONSULTATION   PREOPERATIVE DIAGNOSIS:  Pelvic abscesses.   DISCHARGE DIAGNOSIS:  Pelvic abscesses.   PROCEDURES PERFORMED:  Diagnostic laparoscopy and drainage of pelvic  abscess.   SURGEON:  Almond Lint, MD   ADDITIONAL SURGEON:  Zelphia Cairo, MD, GYN   FINDINGS:  Two fluid collections in the pelvis were opened up and  evacuated.  These were reasonably small.  The remainder of the pelvis  very sucked in with dense adhesions.   ESTIMATED BLOOD LOSS:  Minimal.   ASSESSMENT:  None.   PROCEDURE:  [name] was identified in the holding area and taken to  the operating room where she was placed supine on the operating room  table.  General anesthesia was induced.  Her left arm was tucked and she  was placed on lithotomy position.  The abdomen was prepped and draped in  a sterile fashion.  Time-out was performed according to the surgical  safety checklist.  When all was correct, we continued.  The skin was  anesthetized, this is at the left subcostal margin.  A 5 mm to Optiview  port was placed under direct visualization.  Pneumoperitoneum was  achieved to a pressure of 15 mmHg.  She had minimal adhesions to her  anterior abdominal wall and the patient was placed into Trendelenburg  and rotated to the left.  An additional port was placed in her prior  laparoscopy incision in the left lower quadrant and in the umbilicus.  These were used to facilitate dissection of the pelvis.  There were 2  fluid collections, one found at the right lateral abdominal wall, just  below the cecum, also one was just below left to midline.  The bowel was  carefully  retracted, however, much of that would not separate easily as  it was densely adherent to one another.  The vaginal cuff was seen and  appeared normal.  The left ovary was seen and appeared viable.  The  right ovary was not seen, however, the right pelvis was examined  carefully.  The anterior surface of the rectum was seen.  The cecum was  identified and the base of the cecum appeared normal as well as the  white line of Toldt.  The decision was made not to continue trying to  free up the loops of small bowel and the pelvis laparoscopically as the  edematous bowel was not amenable to retracting for further dissection.  The decision was made given that 2 fluid collections were identified and  evacuated that we would not proceed to trying to lift the small bowel  and attempt to open up all the loops.  This would definitely require an  open laparotomy to complete  safely.  We also decided not to continue  looking for the right ovary, it is approximately 30 minutes worse than  attempting to lyse these adhesions without effect.  Should she have  recurrence of her pain after drainage of the 2  fluid collection, we decided not to open at this date and continue  performing essentially negative exploratory laparotomy after having  positive diagnostic laparoscopy.  The patient was awoken from anesthesia  and taken to the PACU in stable condition.      Almond Lint, MD  Electronically Signed     FB/MEDQ  D:  07/29/2008  T:  07/30/2008  Job:  [id number]

## 2010-09-14 NOTE — Discharge Summary (Signed)
NAMEOliver Stout                             ACCOUNT NO.:  [id number]   MEDICAL RECORD NO.:  [id number]                   PATIENT TYPE:  INP   LOCATION:  9127                                 FACILITY:  [facility]   PHYSICIAN:  Guy Sandifer. Henderson Cloud, M.D.              DATE OF BIRTH:  1976/03/04   DATE OF ADMISSION:  08/29/2003  DATE OF DISCHARGE:  09/06/2003                                 DISCHARGE SUMMARY   ADMITTING DIAGNOSIS:  1. Intrauterine pregnancy at 68 weeks estimated gestational age.  2. Spontaneous onset of labor.   DISCHARGE DIAGNOSES:  1. Status post low transverse cesarean section secondary to fetal     intolerance of labor.  2. Viable female infant.   PROCEDURE:  Primary low transverse cesarean section.   REASON FOR ADMISSION:  Please see written H&P.   HOSPITAL COURSE:  The patient was a 35 year old primigravida that was  admitted to [facility] in early labor with contractions  noted to be every 2-3 minutes.  Artificial rupture of membranes was  performed revealing moderate meconium-stained fluid.  Intrauterine pressure  catheter was placed and also amnioinfusion was started.  Epidural was placed  for the patient's comfort.  After several hours of labor the fetus was noted  to have spontaneous prolonged deceleration for 5-8 minutes down to 80 beats  per minute.  Fetal heart tone did respond to position change, oxygen  administration, and IV fluids, and Pitocin was turned off at that time.  After a good recovery with fetal heart rate accelerations Pitocin was  restarted.  After approximately an hour more of labor another spontaneous  deceleration occurred which lasted approximately 5 minutes but again  responded to position change, oxygen administration, and IV fluids.  Because  of the prolonged deceleration pattern and fetal intolerance of labor  decision was made to proceed with a primary low transverse cesarean section.  The patient was then transferred  to the operating room where epidural was  dosed to an adequate surgical level.  A low transverse incision was made  with the delivery of a viable female infant weighing 7 pounds 5 ounces with  Apgars of 8 at one minute and 9 at five minutes.  Umbilical cord pH was  7.24.  The patient tolerated her procedure well and was taken to the  recovery room in stable condition.  The following day the patient was noted  to have a spike in her temperature to 103.4 and IV antibiotics were started.  On the following morning the patient did complain of a headache.  Lungs were  clear to auscultation.  Abdomen was soft, fundus was firm.  Abdominal  dressing had been removed revealing an incision that was clean, dry, and  intact.  Chest x-ray was performed which revealed some mild interstitial  edema.  The patient was also noted to have some sinus tachycardia in the  130s.  Labs revealed hemoglobin of 8.8; platelet count 175,000; wbc count of  7.9.  Blood gases were drawn which revealed pH of 7.51, PO2 of 84 on room  air.  The patient was started on some oxygen at 2 L per minute.  Oxygen  saturation at that point was 98%.  CT was also performed to rule out a  pulmonary embolus and Dr. Orvan Falconer, infectious disease physician, was also  contacted in reference to response to antibiotic therapy.  CT was negative.  However, heparin was started for coverage for possible septic pelvic  thrombosis.  Temperature continued to spike to 102.5, pulse was 104 to 124.  She remained on oxygen by nasal cannula with O2 saturations of 95-100.  Uterus was mildly tender.  Labs revealed hemoglobin of 8.9, wbc count of  9.3.  Chest x-ray was also repeated which was without significant change;  possible mild interstitial fluid was noted.  IV antibiotics were now changed  to Avelox and heparin was continued.  The patient was given Lasix 20 mg IV.  On postoperative day #4 the patient did feel better.  Temperature was now  down to 99.2  with a maximum of 100.5, blood pressure 140/70.  Fundus was  firm, moderately tender, with good return of bowel function.  Incision was  clean, dry, and intact.  Labs revealed hemoglobin of 8.7, wbc count of 9.4.  She continued on Avelox and heparin.  On postoperative day #5 the patient  was feeling better.  Vital signs were stable; she was now afebrile.  Fundus  was firm, less tender.  Incision was clean, dry, and intact.  She was  voiding without difficulty and ambulating well.  She remained on heparin and  Avelox.  The following morning the patient was afebrile.  Abdomen was soft.  Fundus was firm and nontender.  Incision was clean, dry, and intact.  She  continued on heparin and Avelox.  On the following morning the patient was  feeling well.  She was now afebrile for greater than 24 hours.  Incision was  clean, dry, and intact.  She was now transferred to the Mother-Baby Unit.  On the following a.m. vital signs were stable; she was afebrile.  Abdomen  was soft.  Fundus was firm and nontender.  Incision was clean, dry, and  intact and the patient was discharged home.   CONDITION ON DISCHARGE:  Stable.   DIET:  Regular as tolerated.   ACTIVITY:  No heavy lifting, no driving x2 weeks, no vaginal entry.   FOLLOW-UP:  The patient is to follow up in the office in 1 week for an  incision check.  She is to call for temperature greater than 100 degrees,  persistent nausea and vomiting, heavy vaginal bleeding, and/or redness or  drainage from the incisional site.   DISCHARGE MEDICATIONS:  1. Motrin 600 mg q.6h. p.r.n.  2. Tylox #30 one p.o. q.4-6h. p.r.n.  3. Prenatal vitamins one p.o. daily.  4. Colace one p.o. daily p.r.n.     Julio Sicks, N.P.                        Guy Sandifer. Henderson Cloud, M.D.    CC/MEDQ  D:  10/11/2003  T:  10/11/2003  Job:  [id number]

## 2010-09-14 NOTE — Op Note (Signed)
NAMEOliver Stout                             ACCOUNT NO.:  [id number]   MEDICAL RECORD NO.:  [id number]                   PATIENT TYPE:  INP   LOCATION:  9118                                 FACILITY:  [facility]   PHYSICIAN:  Dineen Kid. Rana Snare, M.D.                 DATE OF BIRTH:  [birth year]   DATE OF PROCEDURE:  08/29/2003  DATE OF DISCHARGE:                                 OPERATIVE REPORT   PREOPERATIVE DIAGNOSES:  1. Intrauterine pregnancy at 39 weeks.  2. Meconium.  3. Fetal intolerance to labor.   POSTOPERATIVE DIAGNOSES:  1. Intrauterine pregnancy at 39 weeks.  2. Meconium.  3. Fetal intolerance to labor.  4. Occiput posterior presentation.   PROCEDURE:  Primary low segment transverse cesarean section.   SURGEON:  Dineen Kid. Rana Snare, M.D.   ANESTHESIA:  Epidural.   INDICATIONS:  [name] is a 35 year old G1, who presented in early labor  with contractions every two to three minutes.  Amniotomy revealed moderate  meconium.  She had an internal pressure placed, and amnioperfusion was  performed.  After several hours of labor, the fetus had a spontaneous  prolonged deceleration for five to eight minutes down to 80 beats per  minute, which responded to position change, oxygen, and IV fluids and  turning Pitocin off.  After good recovery with fetal heart accelerations,  Pitocin was restarted and after approximately an hour or more of labor,  another spontaneous deceleration for approximately five minutes, which  responded again to position change and oxygen and IV fluids.  Because of the  prolonged decelerations and fetal intolerance to labor, the cervix was now 4  cm, completely effaced, and -2 station, we planned to proceed with primary  low segment transverse cesarean section.  The risks and benefits were  discussed, informed consent was obtained.   Findings at the time of surgery were a viable female infant, Apgars were 8 and  9, pH arterial 7.24.  Weight is 7 pounds 5  ounces.   DESCRIPTION OF PROCEDURE:  After adequate analgesia, the patient was placed  in the supine position with left lateral tilt.  She was sterilely prepped  and draped, the bladder was sterilely drained with a Foley catheter.  A  Pfannenstiel skin incision was made two fingerbreadths above the pubic  symphysis, taken down sharply to the fascia, which was incised transversely,  extended superiorly and inferiorly off the bellies of the rectus muscle,  which were separated sharply in the midline.  The peritoneum was entered  sharply, the bladder flap was created and placed behind the bladder blade.  A low segment myotomy incision was made down to the amniotic sac, extended  laterally with the operator's fingertips.  The infant's vertex were  delivered.  The nares and pharynx were deep-suctioned as well as the stomach  with DeLee suction.  The infant was then delivered, cord clamped  and cut,  and handed to the pediatricians for resuscitation.  Cord blood was then  obtained, placenta extracted manually.  The uterus was exteriorized, wiped  clean with a dry lap.  The myotomy incision was closed in two layers, the  first being a running locking layer, the second being an imbricating layer  of 0 Monocryl suture, with good approximation and good hemostasis.  Several  intramural fibroids were noted in the lower segment of the uterus, one  approximately 4 x 2 cm in the midline, and one approximately 2-3 cm in  diameter near the right broad ligament.  Normal fallopian tubes and ovaries  were also noted.  The uterus was placed back in the peritoneal cavity and  after a copious amount of irrigation, adequate hemostasis was assured.  The  peritoneum was closed with 0 Monocryl, the rectus muscle plicated in the  midline, and irrigation was applied and after adequate hemostasis, the  fascia was closed with #1 PDS in running fashion.  Irrigation was again  applied and after adequate hemostasis, the  Scarpa's and Camper's fascia was  closed with 2-0 plain suture in running fashion, skin was stapled, and Steri-  Strips applied.  The patient tolerated the procedure well and was stable on  transfer to the recovery room.  Sponge, needle, and instrument count was  normal x3.  Estimated blood loss during the procedure was 800 mL.  The  patient received 900 mg of Clindamycin after delivery of the placenta.                                               Dineen Kid Rana Snare, M.D.    DCL/MEDQ  D:  08/29/2003  T:  08/30/2003  Job:  [id number]

## 2010-09-14 NOTE — Op Note (Signed)
NAMEOliver Stout                 ACCOUNT NO.:  [id number]   MEDICAL RECORD NO.:  [id number]          PATIENT TYPE:  AMB   LOCATION:  SDC                           FACILITY:  [facility]   PHYSICIAN:  Dineen Kid. Rana Snare, M.D.    DATE OF BIRTH:  1975-05-02   DATE OF PROCEDURE:  10/17/2005  DATE OF DISCHARGE:                                 OPERATIVE REPORT   PREOPERATIVE DIAGNOSIS:  Abnormal uterine bleeding and endometrial mass with  saline infusion ultrasound.   POSTOPERATIVE DIAGNOSIS:  Abnormal uterine bleeding and endometrial mass  with saline infusion ultrasound plus endometrial polyp.   SURGEON:  Dineen Kid. Rana Snare, M.D.   ANESTHESIA:  Monitored anesthesia care plus local by paracervical block.   PROCEDURE:  Hysteroscopy dilatation and curettage with polypectomy.   INDICATIONS:  [name] is a 35 year old G1, P1 with excessive bleeding,  even on birth control pills.  A saline infusion ultrasound shows an  endometrial mass consistent with a polyp.  She presents today for  hysteroscopy, D&C for evaluation and removal of this.  The risks and  benefits of the procedure were discussed at length, and informed consent was  obtained.  Findings at the time of surgery were an endometrial polyp from  the posterior endometrial wall, otherwise normal-appearing ostia, cervix,  and fundus.   DESCRIPTION OF PROCEDURE:  After adequate analgesia, the patient was placed  in the dorsal lithotomy position.  She was sterilely prepped and draped.  The bladder was thoroughly drained.  A Graves speculum was placed.  The  tenaculum was placed in the anterior lip of the cervix.  Paracervical block  was placed with 1% Xylocaine and 1:100,000 epinephrine.  A total of 30 cc  used.  The uterus was sounded to 8 cm, easily dilated with a #25 Pratt  dilator.  The hysteroscope was inserted.  The above findings were noted.  Polyp forceps were used to grasp and remove the endometrial polyp, followed  by sharp curettage,  retrieving small fragments of the endometrial polyp and  also performing this until had a gritty surface on the endometrial cavity.  Examination of the hysteroscope revealed normal-appearing endometrial  cavity.  No residual polyps were noted.  A normal-appearing ostia and  cervix.  The hysteroscope was then removed.  The tenaculum removed from the  anterior lip of the cervix.  It was noted to be hemostatic.  The speculum  was then removed.  The patient was then transferred to the recovery room in  stable condition.  Sponge and instrument counts were normal x3.  Estimated  blood loss was minimal.  Sorbitol deficit 95 cc.  Patient did receive 400 mg  of Cipro preoperatively and 30 mg of Toradol postoperatively.   DISPOSITION:  The patient was discharged home with followup in the office in  2-3 weeks.  Told to return for increased pain, fever, or bleeding.  She was  sent home with a routine instructions sheet for D&C.     Dineen Kid Rana Snare, M.D.  Electronically Signed    DCL/MEDQ  D:  10/17/2005  T:  10/17/2005  Job:  [id number]

## 2010-09-14 NOTE — Discharge Summary (Signed)
NAMEOliver Stout                 ACCOUNT NO.:  [id number]   MEDICAL RECORD NO.:  [id number]          PATIENT TYPE:  INP   LOCATION:  5149                         FACILITY:  [facility]   PHYSICIAN:  Almond Lint, MD       DATE OF BIRTH:  [birth year]   DATE OF ADMISSION:  07/22/2008  DATE OF DISCHARGE:  07/30/2008                               DISCHARGE SUMMARY   PRINCIPAL DIAGNOSIS:  Abdominal pain.   SECONDARY DIAGNOSES:  1. Migraine.  2. Dysfunctional uterine bleeding.   DISCHARGE MEDICATIONS:  1. Doxycycline 100 mg p.o. b.i.d.  2. Zofran 4 mg q.6 h. p.r.n. nausea and vomiting.  3. Percocet 1-2 tabs q.4 h. p.r.n. pain.  4. Imitrex 100 mg p.r.n. migraine.   INSTRUCTIONS:  Do not bathe or swim for initial 2 weeks.  No lifting  over 10 pounds for 3 weeks.   HOSPITAL COURSE:  [name] was admitted to the hospital on July 22, 2008.  She was admitted with abdominal pain and suspicion for SMV  thrombosis or C. difficile.  These were evaluated, and were both  negative.  She received a repeat MRI after her pain did not resolve and  she continued to have a fluid collection in her abdomen and went to the  operating room on July 29, 2008, for drainage.  Two of three collections  were drained.  The third one required would require open laparotomy,  which she was against unless absolutely necessary.  Decision was made to  continue her antibiotics, and see if this resolved given the fact two  abscesses were drained already.  She was discharged home on July 30, 2008, in improved condition.  Her night sweats had resolved and she had  better pain control.  The patient will follow up with me in 1-2 weeks.      Almond Lint, MD  Electronically Signed     FB/MEDQ  D:  09/06/2008  T:  09/07/2008  Job:  [id number]

## 2011-01-10 ENCOUNTER — Emergency Department: Payer: Self-pay | Admitting: Emergency Medicine

## 2011-02-26 ENCOUNTER — Emergency Department (HOSPITAL_COMMUNITY)
Admission: EM | Admit: 2011-02-26 | Discharge: 2011-02-26 | Disposition: A | Discharge status: Home / Self Care | Payer: 59 | Attending: Emergency Medicine | Admitting: Emergency Medicine

## 2011-02-26 ENCOUNTER — Emergency Department (HOSPITAL_COMMUNITY): Payer: 59

## 2011-02-26 DIAGNOSIS — R51 Headache: Secondary | ICD-10-CM | POA: Insufficient documentation

## 2011-07-13 IMAGING — US IR FLUORO GUIDE CV LINE*L*
1 series · 2 of 2 positions shown · non-contrast
Comparison: none

CLINICAL DATA: Poor venous access.  The patient scheduled for
operative procedure.  Request has been made for PICC line
placement.

[Series 1: ir us guide vasc access*right* · 2 of 2 slices shown]
[im 1/2]
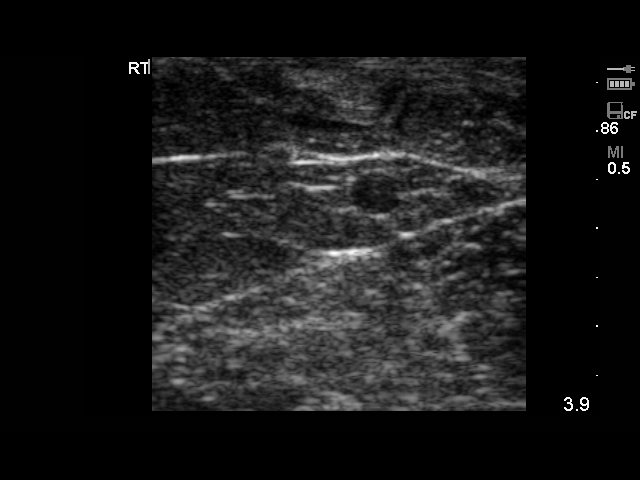
[im 2/2]
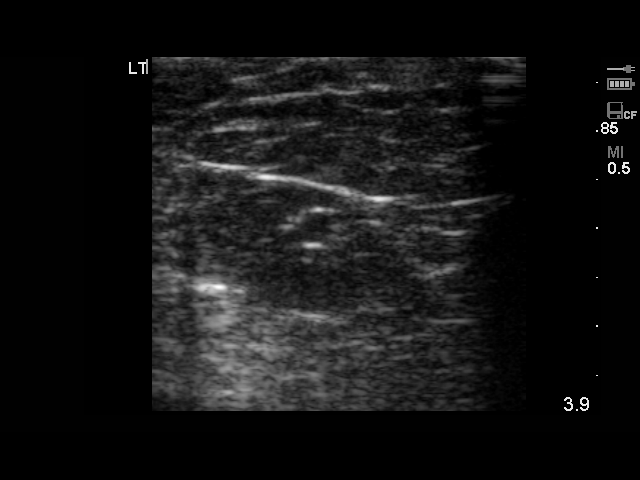

[2 of 2 positions shown; findings below may reference images not displayed]

PICC LINE PLACEMENT WITH ULTRASOUND AND FLUOROSCOPIC  GUIDANCE

Fluoroscopy Time: 0.7 minutes.

The right arm was prepped with chlorhexidine, draped in the usual
sterile fashion using maximum barrier technique (cap and mask,
sterile gown, sterile gloves, large sterile sheet, hand hygiene and
cutaneous antisepsis) and infiltrated locally with 1% Lidocaine.

Ultrasound demonstrated patency of the right cephalic vein, and
this was documented with an image.  Under real-time ultrasound
guidance, this vein was accessed with a 21 gauge micropuncture
needle and image documentation was performed.  The needle was
exchanged over a guidewire for a peel-away sheath through which a
five French dual lumen PICC trimmed to 41 cm was advanced,
positioned with its tip at the lower SVC/right atrial junction.
Fluoroscopy during the procedure and fluoro spot radiograph
confirms appropriate catheter position.  The catheter was flushed,
secured to the skin with Prolene sutures, and covered with a
sterile dressing.

Complications:  None immediate
IMPRESSION: Successful right arm PICC line placement with ultrasound and
fluoroscopic guidance.  The catheter is ready for use.

Read by: Re La, Irilda.-FORNY

## 2013-01-12 ENCOUNTER — Other Ambulatory Visit: Payer: Self-pay | Admitting: Obstetrics and Gynecology

## 2013-01-12 DIAGNOSIS — N6310 Unspecified lump in the right breast, unspecified quadrant: Secondary | ICD-10-CM

## 2013-01-14 ENCOUNTER — Ambulatory Visit
Admission: RE | Admit: 2013-01-14 | Discharge: 2013-01-14 | Disposition: A | Discharge status: Home / Self Care | Payer: PRIVATE HEALTH INSURANCE | Source: Ambulatory Visit | Attending: Obstetrics and Gynecology | Admitting: Obstetrics and Gynecology

## 2013-01-14 DIAGNOSIS — N6310 Unspecified lump in the right breast, unspecified quadrant: Secondary | ICD-10-CM

## 2013-01-22 ENCOUNTER — Encounter: Payer: Self-pay | Admitting: Family Medicine

## 2013-01-22 ENCOUNTER — Ambulatory Visit: Payer: Self-pay | Admitting: Family Medicine

## 2013-01-22 ENCOUNTER — Ambulatory Visit (INDEPENDENT_AMBULATORY_CARE_PROVIDER_SITE_OTHER): Payer: PRIVATE HEALTH INSURANCE | Admitting: Family Medicine

## 2013-01-22 VITALS — BP 110/90 | HR 76 | Temp 98.5°F | Resp 20 | Ht 62.0 in | Wt 265.0 lb

## 2013-01-22 DIAGNOSIS — IMO0002 Reserved for concepts with insufficient information to code with codable children: Secondary | ICD-10-CM | POA: Insufficient documentation

## 2013-01-22 DIAGNOSIS — G43709 Chronic migraine without aura, not intractable, without status migrainosus: Secondary | ICD-10-CM | POA: Insufficient documentation

## 2013-01-22 DIAGNOSIS — L659 Nonscarring hair loss, unspecified: Secondary | ICD-10-CM

## 2013-01-22 LAB — CBC WITH DIFFERENTIAL/PLATELET
Basophils Absolute: 0 10*3/uL (ref 0.0–0.1)
Eosinophils Absolute: 0.1 10*3/uL (ref 0.0–0.7)
Eosinophils Relative: 1 % (ref 0–5)
Lymphs Abs: 1.2 10*3/uL (ref 0.7–4.0)
MCH: 26.9 pg (ref 26.0–34.0)
MCV: 80.5 fL (ref 78.0–100.0)
Platelets: 223 10*3/uL (ref 150–400)
RDW: 15.2 % (ref 11.5–15.5)

## 2013-01-22 LAB — COMPLETE METABOLIC PANEL WITH GFR
ALT: 16 U/L (ref 0–35)
AST: 13 U/L (ref 0–37)
Albumin: 4.5 g/dL (ref 3.5–5.2)
Calcium: 9.5 mg/dL (ref 8.4–10.5)
Chloride: 105 mEq/L (ref 96–112)
Potassium: 4.2 mEq/L (ref 3.5–5.3)
Total Protein: 6.7 g/dL (ref 6.0–8.3)

## 2013-01-22 NOTE — Progress Notes (Signed)
  Subjective:    Patient ID: Robin Stout, female    DOB: [birth year], 37 y.o.   MRN: [id number]  HPI Patient is a 37 year old white female who comes in with gradual alopecia of the scalp. She has noticed thinning of her hair in the center of her scalp for several months. She is not experienced any thinning of her hair along the sides. She denies any fevers chills, serious illness, emotional stress, unusual rashes, muscle pains, joint pains, anxiety, or depression. She's had no recent changes in her medications. She's had no hospitalizations. Past Medical History  Diagnosis Date  . Migraines    No current outpatient prescriptions on file prior to visit.   No current facility-administered medications on file prior to visit.   Allergies  Allergen Reactions  . Penicillins Swelling   History   Social History  . Marital Status: Married    Spouse Name: N/A    Number of Children: N/A  . Years of Education: N/A   Occupational History  . Not on file.   Social History Main Topics  . Smoking status: Never Smoker   . Smokeless tobacco: Not on file  . Alcohol Use: Yes     Comment: occasional  . Drug Use: No  . Sexual Activity: Not on file   Other Topics Concern  . Not on file   Social History Narrative  . No narrative on file   No family history on file.    Review of Systems  All other systems reviewed and are negative.       Objective:   Physical Exam  Vitals reviewed. Constitutional: She appears well-developed and well-nourished.  Neck: Neck supple. No thyromegaly present.  Cardiovascular: Normal rate, regular rhythm, normal heart sounds and intact distal pulses.   No murmur heard. Pulmonary/Chest: Effort normal and breath sounds normal. No respiratory distress. She has no wheezes. She has no rales. She exhibits no tenderness.  Abdominal: Soft. Bowel sounds are normal. She exhibits no distension and no mass. There is no tenderness. There is no rebound and no guarding.   Musculoskeletal: She exhibits no edema.  Lymphadenopathy:    She has no cervical adenopathy.  Skin: No erythema.   There is thinning of the hair in an androgenic pattern in the center of her scalp.       Assessment & Plan:  1. 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 There is no evidence of scarring in the scalp or infection. It does not appear to be discord with this. I have recommended Rogaine for women over-the-counter. I will check a CBC a CMP and ANA and TSH and iron level to rule out metabolic causes of alopecia. - TSH - COMPLETE METABOLIC PANEL WITH GFR - CBC with Differential - ANA - Iron

## 2013-01-23 LAB — TSH: TSH: 0.845 u[IU]/mL (ref 0.350–4.500)

## 2013-01-26 ENCOUNTER — Other Ambulatory Visit: Payer: 59

## 2013-01-27 ENCOUNTER — Other Ambulatory Visit: Payer: 59

## 2013-02-03 ENCOUNTER — Telehealth: Payer: Self-pay | Admitting: Family Medicine

## 2013-02-03 NOTE — Telephone Encounter (Signed)
Patient needs to have her Zomig Rx refilled . [facility] has told her that they are DC this medicine and she needs something that will help her as well as this medicine.

## 2013-02-03 NOTE — Telephone Encounter (Signed)
Change med?

## 2013-02-04 MED ORDER — SUMATRIPTAN SUCCINATE 50 MG PO TABS: 50.0000 mg | ORAL_TABLET | ORAL | Status: DC | PRN

## 2013-02-04 NOTE — Telephone Encounter (Signed)
Switch to imitrex 50 mg

## 2013-02-04 NOTE — Telephone Encounter (Signed)
Med switched to Imitrex

## 2013-04-01 ENCOUNTER — Ambulatory Visit (INDEPENDENT_AMBULATORY_CARE_PROVIDER_SITE_OTHER): Payer: PRIVATE HEALTH INSURANCE | Admitting: Physician Assistant

## 2013-04-01 ENCOUNTER — Encounter: Payer: Self-pay | Admitting: Physician Assistant

## 2013-04-01 VITALS — BP 116/80 | HR 80 | Temp 98.4°F | Resp 18 | Wt 242.0 lb

## 2013-04-01 DIAGNOSIS — B029 Zoster without complications: Secondary | ICD-10-CM

## 2013-04-01 MED ORDER — VALACYCLOVIR HCL 1 G PO TABS: 1000.0000 mg | ORAL_TABLET | Freq: Three times a day (TID) | ORAL | Status: DC

## 2013-04-01 MED ORDER — GABAPENTIN 300 MG PO CAPS: ORAL_CAPSULE | ORAL | Status: DC

## 2013-04-01 NOTE — Progress Notes (Signed)
Patient ID: Robin Stout MRN: [id number], DOB: 02/03/1976, 37 y.o. Date of Encounter: 04/01/2013, 1:18 PM    Chief Complaint:  Chief Complaint  Patient presents with  . c/o poss shingles on back    discuss migraine meds     HPI: 37 y.o. year old white female reports that on Monday 03/29/13 she felt as if she had a virus. Felt extremely achy had some runny nose and some headache. Felt so bad  she went home early from work. On that day she felt a very small bump on her back and had her husband look at it but he said it was just a very minor area consistent with a little irritation. She says that the following day she felt much better regarding the viral type symptoms. Today she noticed a second area of rash on her back.   last night she started to notice a burning pain in her right upper back. She says that the pain is not severe and feels that it is mostly irritated because it is at site of her bra strap.     Home Meds: See attached medication section for any medications that were entered at today's visit. The computer does not put those onto this list.The following list is a list of meds entered prior to today's visit.   Current Outpatient Prescriptions on File Prior to Visit  Medication Sig Dispense Refill  . SUMAtriptan (IMITREX) 50 MG tablet Take 1 tablet (50 mg total) by mouth every 2 (two) hours as needed for migraine. May repeat in 2 hours if headache persists or recurs.  10 tablet  0  . zolmitriptan (ZOMIG) 5 MG tablet Take 5 mg by mouth as needed for migraine.       No current facility-administered medications on file prior to visit.    Allergies:  Allergies  Allergen Reactions  . Penicillins Swelling      Review of Systems: See HPI for pertinent ROS. All other ROS negative.    Physical Exam: Blood pressure 116/80, pulse 80, temperature 98.4 F (36.9 C), temperature source Oral, resp. rate 18, weight 242 lb (109.77 kg)., Body mass index is 44.25  kg/(m^2). General: Obese white female. Appears in no acute distress. Lungs: Clear bilaterally to auscultation without wheezes, rales, or rhonchi. Breathing is unlabored. Heart: Regular rhythm. No murmurs, rubs, or gallops. Msk:  Strength and tone normal for age. Skin: Right upper Back:  There is an 2 inch diameter area that is light pink erythema. Within this area, there is a 1/2 inch area of blister.   Inferior to the above lesion, there is a second area that is 2" x 1". This area has diffuse erythema and some blister. Neuro: Alert and oriented X 3. Moves all extremities spontaneously. Gait is normal. CNII-XII grossly in tact. Psych:  Responds to questions appropriately with a normal affect.     ASSESSMENT AND PLAN:  37 y.o. year old female with  1. Herpes zoster  - valACYclovir (VALTREX) 1000 MG tablet; Take 1 tablet (1,000 mg total) by mouth 3 (three) times daily. For 7 days  Dispense: 21 tablet; Refill: 0 - gabapentin (NEURONTIN) 300 MG capsule; Day 1: take 1 a day.               Day 2: Take one  twice a day.                  Day 3 and after: Take one 3 times a day.  Dispense: 90 capsule; Refill: 3  Follow up if pain not controlled with the above Neurontin. Told her to use the Neurontin for about 3 weeks then try to taper off of it. If  Pain is still present at that time can then return back on the Neurontin until the pain does resolve.  Signed, [address] [place], [place], [facility] 04/01/2013 1:18 PM

## 2013-04-30 ENCOUNTER — Telehealth: Payer: Self-pay | Admitting: *Deleted

## 2013-04-30 MED ORDER — RIZATRIPTAN BENZOATE 10 MG PO TABS: 10.0000 mg | ORAL_TABLET | ORAL | Status: DC | PRN

## 2013-04-30 NOTE — Telephone Encounter (Signed)
Patient aware and med sent to pharm 

## 2013-04-30 NOTE — Telephone Encounter (Signed)
Try maxalt 10 mg po prn migraine.  May repeat in 2 hrs if headache persists x 1.  If this does not work, I would like to see her to discuss preventative strategies.

## 2013-11-25 ENCOUNTER — Other Ambulatory Visit: Payer: Self-pay | Admitting: Family Medicine

## 2013-11-25 NOTE — Telephone Encounter (Signed)
Refill appropriate and filled per protocol. 

## 2014-04-07 ENCOUNTER — Encounter: Payer: Self-pay | Admitting: Family Medicine

## 2014-04-07 ENCOUNTER — Ambulatory Visit (INDEPENDENT_AMBULATORY_CARE_PROVIDER_SITE_OTHER): Payer: PRIVATE HEALTH INSURANCE | Admitting: Family Medicine

## 2014-04-07 VITALS — BP 100/62 | HR 60 | Temp 98.1°F | Resp 18 | Ht 61.0 in | Wt 150.0 lb

## 2014-04-07 DIAGNOSIS — G43009 Migraine without aura, not intractable, without status migrainosus: Secondary | ICD-10-CM

## 2014-04-07 MED ORDER — PROMETHAZINE HCL 25 MG PO TABS: 25.0000 mg | ORAL_TABLET | Freq: Three times a day (TID) | ORAL | Status: DC | PRN

## 2014-04-07 MED ORDER — RIZATRIPTAN BENZOATE 10 MG PO TBDP: 10.0000 mg | ORAL_TABLET | ORAL | Status: DC | PRN

## 2014-04-07 NOTE — Progress Notes (Signed)
   Subjective:    Patient ID: Robin Stout, female    DOB: 03-23-1976, 38 y.o.   MRN: [id number]  HPI  Patient has 3 severe migraines per month. She is currently taking Maxalt without substantial relief. The headaches are associated with photophobia, phonophobia. She also gets nausea with the headaches. Disintegrating tablet seemed to work better for her. She is overdue for cholesterol screen. Past Medical History  Diagnosis Date  . Migraines    No past surgical history on file. Current Outpatient Prescriptions on File Prior to Visit  Medication Sig Dispense Refill  . rizatriptan (MAXALT) 10 MG tablet TAKE 1 TABLET BY MOUTH AS NEEDED FOR MIGRAINE, MAY REPEAT IN 2 HOURS IF NEEDED 10 tablet 0   No current facility-administered medications on file prior to visit.   Allergies  Allergen Reactions  . Penicillins Swelling   History   Social History  . Marital Status: Married    Spouse Name: N/A    Number of Children: N/A  . Years of Education: N/A   Occupational History  . Not on file.   Social History Main Topics  . Smoking status: Never Smoker   . Smokeless tobacco: Not on file  . Alcohol Use: Yes     Comment: occasional  . Drug Use: No  . Sexual Activity: Not on file   Other Topics Concern  . Not on file   Social History Narrative     Review of Systems  All other systems reviewed and are negative.      Objective:   Physical Exam  Constitutional: She is oriented to person, place, and time.  Cardiovascular: Normal rate, regular rhythm, normal heart sounds and intact distal pulses.   Pulmonary/Chest: Effort normal and breath sounds normal. No respiratory distress. She has no wheezes. She has no rales. She exhibits no tenderness.  Neurological: She is alert and oriented to person, place, and time. She has normal reflexes. She displays normal reflexes. No cranial nerve deficit. She exhibits normal muscle tone. Coordination normal.  Vitals reviewed.           Assessment & Plan:  Nonintractable migraine, unspecified migraine type - Plan: promethazine (PHENERGAN) 25 MG tablet, rizatriptan (MAXALT-MLT) 10 MG disintegrating tablet  Switch the patient to Maxalt disintegrating tablet 10 mg 1 by mouth when necessary migraine area I'll also give the patient Phenergan 25 mg tablets to be taken every 6 hours as needed for migraine. Patient should return fasting for a CMP and fasting lipid panel.

## 2014-04-29 HISTORY — PX: ABDOMINOPLASTY: SUR9

## 2014-07-04 ENCOUNTER — Other Ambulatory Visit: Payer: Self-pay | Admitting: Family Medicine

## 2014-12-07 DIAGNOSIS — R634 Abnormal weight loss: Secondary | ICD-10-CM | POA: Insufficient documentation

## 2014-12-07 DIAGNOSIS — L574 Cutis laxa senilis: Secondary | ICD-10-CM | POA: Insufficient documentation

## 2015-01-06 ENCOUNTER — Encounter: Payer: Self-pay | Admitting: Family Medicine

## 2015-01-06 ENCOUNTER — Ambulatory Visit (INDEPENDENT_AMBULATORY_CARE_PROVIDER_SITE_OTHER): Payer: PRIVATE HEALTH INSURANCE | Admitting: Family Medicine

## 2015-01-06 VITALS — BP 110/70 | HR 68 | Temp 98.3°F | Resp 18 | Ht 62.0 in | Wt 145.0 lb

## 2015-01-06 DIAGNOSIS — H8112 Benign paroxysmal vertigo, left ear: Secondary | ICD-10-CM | POA: Diagnosis not present

## 2015-01-06 LAB — CBC WITH DIFFERENTIAL/PLATELET
Basophils Absolute: 0.1 10*3/uL (ref 0.0–0.1)
Basophils Relative: 1 % (ref 0–1)
Eosinophils Absolute: 0.1 10*3/uL (ref 0.0–0.7)
Eosinophils Relative: 1 % (ref 0–5)
HEMATOCRIT: 40.4 % (ref 36.0–46.0)
HEMOGLOBIN: 12.8 g/dL (ref 12.0–15.0)
LYMPHS PCT: 21 % (ref 12–46)
Lymphs Abs: 1.2 10*3/uL (ref 0.7–4.0)
MCH: 27.3 pg (ref 26.0–34.0)
MCHC: 31.7 g/dL (ref 30.0–36.0)
MCV: 86.1 fL (ref 78.0–100.0)
MONO ABS: 0.3 10*3/uL (ref 0.1–1.0)
MONOS PCT: 5 % (ref 3–12)
MPV: 11.8 fL (ref 8.6–12.4)
NEUTROS ABS: 4.2 10*3/uL (ref 1.7–7.7)
Neutrophils Relative %: 72 % (ref 43–77)
Platelets: 176 10*3/uL (ref 150–400)
RBC: 4.69 MIL/uL (ref 3.87–5.11)
RDW: 14.6 % (ref 11.5–15.5)
WBC: 5.8 10*3/uL (ref 4.0–10.5)

## 2015-01-06 NOTE — Progress Notes (Signed)
   Subjective:    Patient ID: Robin Stout, female    DOB: 08-18-1975, 39 y.o.   MRN: [id number]  HPI Patient has been having intermittent dizzy spells for the last 3-4 weeks. They are always associated with position changes or turning her head. They occur when she stands up, when she bends over, when she turns her head to the side rapidly. She denies the room spinning but she does feel a movement sensation and an "off balance" sensation.  On examination today she does have a positive Dix-Hallpike maneuver to the left. She denies any syncope. She denies any chest palpitations. She denies any chest pain. She denies any rapid sudden weight loss. She denies any  Obvious blood loss. He denies any hearing loss, tinnitus, headaches, sudden vision changes, or diplopia Past Medical History  Diagnosis Date  . Migraines    No past surgical history on file. Current Outpatient Prescriptions on File Prior to Visit  Medication Sig Dispense Refill  . promethazine (PHENERGAN) 25 MG tablet Take 1 tablet (25 mg total) by mouth every 8 (eight) hours as needed for nausea or vomiting. 20 tablet 0  . rizatriptan (MAXALT-MLT) 10 MG disintegrating tablet TAKE 1 TABLET BY MOUTH AS NEEDED FOR MIGRANE, MAY REPEAT IN 2 HOURS IF NEEDED 10 tablet 3   No current facility-administered medications on file prior to visit.   Allergies  Allergen Reactions  . Penicillins Swelling   Social History   Social History  . Marital Status: Married    Spouse Name: N/A  . Number of Children: N/A  . Years of Education: N/A   Occupational History  . Not on file.   Social History Main Topics  . Smoking status: Never Smoker   . Smokeless tobacco: Not on file  . Alcohol Use: Yes     Comment: occasional  . Drug Use: No  . Sexual Activity: Not on file   Other Topics Concern  . Not on file   Social History Narrative     Review of Systems  All other systems reviewed and are negative.      Objective:   Physical Exam    Constitutional: She is oriented to person, place, and time. She appears well-developed and well-nourished.  Cardiovascular: Normal rate, regular rhythm and normal heart sounds.   No murmur heard. Pulmonary/Chest: Effort normal and breath sounds normal. No respiratory distress. She has no wheezes. She has no rales.  Abdominal: Soft. Bowel sounds are normal. She exhibits no distension. There is no tenderness. There is no rebound and no guarding.  Neurological: She is alert and oriented to person, place, and time. She has normal reflexes. She displays normal reflexes. No cranial nerve deficit. She exhibits normal muscle tone. Coordination normal.  Vitals reviewed.         Assessment & Plan:  Benign paroxysmal positional vertigo, left - Plan: COMPLETE METABOLIC PANEL WITH GFR, CBC with Differential/Platelet  I suspect the patient is having benign paroxysmal positional vertigo. I recommended Epley maneuvers and tincture of time over the next 2 weeks. I will check a CBC to monitor for evidence of anemia. If symptoms persist or worsen, proceed with an MRI of the brain

## 2015-01-07 LAB — COMPLETE METABOLIC PANEL WITH GFR
ALBUMIN: 4.3 g/dL (ref 3.6–5.1)
ALT: 25 U/L (ref 6–29)
AST: 21 U/L (ref 10–30)
Alkaline Phosphatase: 44 U/L (ref 33–115)
BUN: 15 mg/dL (ref 7–25)
CHLORIDE: 104 mmol/L (ref 98–110)
CO2: 27 mmol/L (ref 20–31)
Calcium: 9.3 mg/dL (ref 8.6–10.2)
Creat: 0.77 mg/dL (ref 0.50–1.10)
GFR, Est African American: >89 mL/min (ref >=60)
GFR, Est Non African American: >89 mL/min (ref >=60)
GLUCOSE: 88 mg/dL (ref 70–99)
POTASSIUM: 4.1 mmol/L (ref 3.5–5.3)
SODIUM: 135 mmol/L (ref 135–146)
Total Bilirubin: 0.7 mg/dL (ref 0.2–1.2)
Total Protein: 6.2 g/dL (ref 6.1–8.1)

## 2015-01-10 ENCOUNTER — Encounter: Payer: Self-pay | Admitting: Family Medicine

## 2015-03-30 ENCOUNTER — Emergency Department (HOSPITAL_COMMUNITY): Payer: Worker's Compensation

## 2015-03-30 ENCOUNTER — Encounter (HOSPITAL_COMMUNITY): Payer: Self-pay | Admitting: *Deleted

## 2015-03-30 ENCOUNTER — Emergency Department (HOSPITAL_COMMUNITY)
Admission: EM | Admit: 2015-03-30 | Discharge: 2015-03-30 | Disposition: A | Discharge status: Home / Self Care | Payer: Worker's Compensation | Attending: Emergency Medicine | Admitting: Emergency Medicine

## 2015-03-30 DIAGNOSIS — W228XXA Striking against or struck by other objects, initial encounter: Secondary | ICD-10-CM | POA: Insufficient documentation

## 2015-03-30 DIAGNOSIS — Y9389 Activity, other specified: Secondary | ICD-10-CM | POA: Insufficient documentation

## 2015-03-30 DIAGNOSIS — Y998 Other external cause status: Secondary | ICD-10-CM | POA: Insufficient documentation

## 2015-03-30 DIAGNOSIS — S0990XA Unspecified injury of head, initial encounter: Secondary | ICD-10-CM | POA: Diagnosis not present

## 2015-03-30 DIAGNOSIS — Y92513 Shop (commercial) as the place of occurrence of the external cause: Secondary | ICD-10-CM | POA: Diagnosis not present

## 2015-03-30 DIAGNOSIS — G43909 Migraine, unspecified, not intractable, without status migrainosus: Secondary | ICD-10-CM | POA: Insufficient documentation

## 2015-03-30 DIAGNOSIS — Z880 Allergy status to penicillin: Secondary | ICD-10-CM | POA: Diagnosis not present

## 2015-03-30 HISTORY — PX: OTHER SURGICAL HISTORY: SHX169

## 2015-03-30 MED ORDER — ONDANSETRON 4 MG PO TBDP
8.0000 mg | ORAL_TABLET | Freq: Once | ORAL | Status: AC
  Administered 2015-03-30: 8 mg via ORAL
  Filled 2015-03-30: qty 2

## 2015-03-30 MED ORDER — ACETAMINOPHEN 500 MG PO TABS
1000.0000 mg | ORAL_TABLET | Freq: Once | ORAL | Status: AC
  Administered 2015-03-30: 1000 mg via ORAL
  Filled 2015-03-30: qty 2

## 2015-03-30 NOTE — ED Provider Notes (Signed)
CSN: [id number]     Arrival date & time 03/30/15  1320 History   First MD Initiated Contact with Patient 03/30/15 1544     Chief Complaint  Patient presents with  . Head Injury     (Consider location/radiation/quality/duration/timing/severity/associated sxs/prior Treatment) HPI.... Patient was in [employer] looking at the bicycles when she lifted her head up abruptly and struck the left occipital area of her scalp on the edge of the rack. She felt nauseated and vomited once. She feels slightly off balance. She is able to ambulate without falling. Severity is mild to moderate.  Past Medical History  Diagnosis Date  . Migraines    Past Surgical History  Procedure Laterality Date  . Abdominal hysterectomy    . Appendectomy    . Cesarean section     No family history on file. Social History  Substance Use Topics  . Smoking status: Never Smoker   . Smokeless tobacco: None  . Alcohol Use: Yes     Comment: occasional   OB History    No data available     Review of Systems  All other systems reviewed and are negative.     Allergies  Penicillins  Home Medications   Prior to Admission medications   Medication Sig Start Date End Date Taking? Authorizing Provider  promethazine (PHENERGAN) 25 MG tablet Take 1 tablet (25 mg total) by mouth every 8 (eight) hours as needed for nausea or vomiting. 04/07/14  Yes Donita Brooks, MD  rizatriptan (MAXALT-MLT) 10 MG disintegrating tablet TAKE 1 TABLET BY MOUTH AS NEEDED FOR MIGRANE, MAY REPEAT IN 2 HOURS IF NEEDED 07/04/14  Yes Donita Brooks, MD   BP 94/77 mmHg  Pulse 48  Temp(Src) 98.3 F (36.8 C) (Oral)  Resp 18  SpO2 100% Physical Exam  Constitutional: She is oriented to person, place, and time. She appears well-developed and well-nourished.  HENT:  Head: Normocephalic.  Slight tenderness left occipital area.  Eyes: Conjunctivae and EOM are normal. Pupils are equal, round, and reactive to light.  Neck: Normal range of  motion. Neck supple.  Cardiovascular: Normal rate and regular rhythm.   Pulmonary/Chest: Effort normal and breath sounds normal.  Abdominal: Soft. Bowel sounds are normal.  Musculoskeletal: Normal range of motion.  Neurological: She is alert and oriented to person, place, and time.  Skin: Skin is warm and dry.  Psychiatric: She has a normal mood and affect. Her behavior is normal.  Nursing note and vitals reviewed.   ED Course  Procedures (including critical care time) Labs Review Labs Reviewed - No data to display  Imaging Review Ct Head Wo Contrast  03/30/2015  CLINICAL DATA:  Head injury.  Vomiting. EXAM: CT HEAD WITHOUT CONTRAST TECHNIQUE: Contiguous axial images were obtained from the base of the skull through the vertex without intravenous contrast. COMPARISON:  02/26/2011 FINDINGS: Brain parenchyma, ventricular system, and extra-axial space are within normal limits. There is no mass effect, midline shift, or acute hemorrhage. Cranium is intact. Mastoid air cells are clear. IMPRESSION: No acute intracranial pathology. Electronically Signed   By: Jolaine Click M.D.   On: 03/30/2015 17:06   I have personally reviewed and evaluated these images and lab results as part of my medical decision-making.   EKG Interpretation None      MDM   Final diagnoses:  Minor head injury, initial encounter    Patient is exhibiting no gross neurological deficits. CT head negative. Discussed findings with patient and her husband.  Donnetta HutchingBrian Arthea Nobel, MD 03/30/15 [phone]

## 2015-03-30 NOTE — Discharge Instructions (Signed)
Concussion, Adult °A concussion is a brain injury. It is caused by: °· A hit to the head. °· A quick and sudden movement (jolt) of the head or neck. °A concussion is usually not life threatening. Even so, it can cause serious problems. If you had a concussion before, you may have concussion-like problems after a hit to your head. °HOME CARE °General Instructions °· Follow your doctor's directions carefully. °· Take medicines only as told by your doctor. °· Only take medicines your doctor says are safe. °· Do not drink alcohol until your doctor says it is okay. Alcohol and some drugs can slow down healing. They can also put you at risk for further injury. °· If you are having trouble remembering things, write them down. °· Try to do one thing at a time if you get distracted easily. For example, do not watch TV while making dinner. °· Talk to your family members or close friends when making important decisions. °· Follow up with your doctor as told. °· Watch your symptoms. Tell others to do the same. Serious problems can sometimes happen after a concussion. Older adults are more likely to have these problems. °· Tell your teachers, school nurse, school counselor, coach, athletic trainer, or work manager about your concussion. Tell them about what you can or cannot do. They should watch to see if: °¨ It gets even harder for you to pay attention or concentrate. °¨ It gets even harder for you to remember things or learn new things. °¨ You need more time than normal to finish things. °¨ You become annoyed (irritable) more than before. °¨ You are not able to deal with stress as well. °¨ You have more problems than before. °· Rest. Make sure you: °¨ Get plenty of sleep at night. °¨ Go to sleep early. °¨ Go to bed at the same time every day. Try to wake up at the same time. °¨ Rest during the day. °¨ Take naps when you feel tired. °· Limit activities where you have to think a lot or concentrate. These include: °¨ Doing  homework. °¨ Doing work related to a job. °¨ Watching TV. °¨ Using the computer. °Returning To Your Regular Activities °Return to your normal activities slowly, not all at once. You must give your body and brain enough time to heal.  °· Do not play sports or do other athletic activities until your doctor says it is okay. °· Ask your doctor when you can drive, ride a bicycle, or work other vehicles or machines. Never do these things if you feel dizzy. °· Ask your doctor about when you can return to work or school. °Preventing Another Concussion °It is very important to avoid another brain injury, especially before you have healed. In rare cases, another injury can lead to permanent brain damage, brain swelling, or death. The risk of this is greatest during the first 7-10 days after your injury. Avoid injuries by:  °· Wearing a seat belt when riding in a car. °· Not drinking too much alcohol. °· Avoiding activities that could lead to a second concussion (such as contact sports). °· Wearing a helmet when doing activities like: °¨ Biking. °¨ Skiing. °¨ Skateboarding. °¨ Skating. °· Making your home safer by: °¨ Removing things from the floor or stairways that could make you trip. °¨ Using grab bars in bathrooms and handrails by stairs. °¨ Placing non-slip mats on floors and in bathtubs. °¨ Improve lighting in dark areas. °GET HELP IF: °·   It gets even harder for you to pay attention or concentrate. °· It gets even harder for you to remember things or learn new things. °· You need more time than normal to finish things. °· You become annoyed (irritable) more than before. °· You are not able to deal with stress as well. °· You have more problems than before. °· You have problems keeping your balance. °· You are not able to react quickly when you should. °Get help if you have any of these problems for more than 2 weeks:  °· Lasting (chronic) headaches. °· Dizziness or trouble balancing. °· Feeling sick to your stomach  (nausea). °· Seeing (vision) problems. °· Being affected by noises or light more than normal. °· Feeling sad, low, down in the dumps, blue, gloomy, or empty (depressed). °· Mood changes (mood swings). °· Feeling of fear or nervousness about what may happen (anxiety). °· Feeling annoyed. °· Memory problems. °· Problems concentrating or paying attention. °· Sleep problems. °· Feeling tired all the time. °GET HELP RIGHT AWAY IF:  °· You have bad headaches or your headaches get worse. °· You have weakness (even if it is in one hand, leg, or part of the face). °· You have loss of feeling (numbness). °· You feel off balance. °· You keep throwing up (vomiting). °· You feel tired. °· One black center of your eye (pupil) is larger than the other. °· You twitch or shake violently (convulse). °· Your speech is not clear (slurred). °· You are more confused, easily angered (agitated), or annoyed than before. °· You have more trouble resting than before. °· You are unable to recognize people or places. °· You have neck pain. °· It is difficult to wake you up. °· You have unusual behavior changes. °· You pass out (lose consciousness). °MAKE SURE YOU:  °· Understand these instructions. °· Will watch your condition. °· Will get help right away if you are not doing well or get worse. °  °This information is not intended to replace advice given to you by your health care provider. Make sure you discuss any questions you have with your health care provider. °  °Document Released: 04/03/2009 Document Revised: 05/06/2014 Document Reviewed: 11/05/2012 °Elsevier Interactive Patient Education ©2016 [employer] ° °

## 2015-03-30 NOTE — ED Notes (Signed)
PT was at [employer] and hit posterior head on the metal bike rack and it made her a little loopy, then patient got nauseated and vomited.  Pt reports she lost balance and just feels slow.  No blood thinners

## 2015-06-01 ENCOUNTER — Encounter: Payer: Self-pay | Admitting: Physician Assistant

## 2015-06-01 ENCOUNTER — Ambulatory Visit (INDEPENDENT_AMBULATORY_CARE_PROVIDER_SITE_OTHER): Payer: PRIVATE HEALTH INSURANCE | Admitting: Physician Assistant

## 2015-06-01 ENCOUNTER — Encounter: Payer: Self-pay | Admitting: Family Medicine

## 2015-06-01 VITALS — BP 102/64 | HR 64 | Temp 99.4°F | Resp 18 | Wt 142.0 lb

## 2015-06-01 DIAGNOSIS — R110 Nausea: Secondary | ICD-10-CM | POA: Diagnosis not present

## 2015-06-01 DIAGNOSIS — R143 Flatulence: Secondary | ICD-10-CM

## 2015-06-01 DIAGNOSIS — G43009 Migraine without aura, not intractable, without status migrainosus: Secondary | ICD-10-CM | POA: Diagnosis not present

## 2015-06-01 DIAGNOSIS — R197 Diarrhea, unspecified: Secondary | ICD-10-CM | POA: Diagnosis not present

## 2015-06-01 DIAGNOSIS — R109 Unspecified abdominal pain: Secondary | ICD-10-CM | POA: Diagnosis not present

## 2015-06-01 MED ORDER — PROMETHAZINE HCL 25 MG PO TABS: 25.0000 mg | ORAL_TABLET | Freq: Three times a day (TID) | ORAL | Status: DC | PRN

## 2015-06-02 ENCOUNTER — Other Ambulatory Visit: Payer: PRIVATE HEALTH INSURANCE

## 2015-06-02 ENCOUNTER — Other Ambulatory Visit: Payer: Self-pay | Admitting: Physician Assistant

## 2015-06-02 LAB — COMPLETE METABOLIC PANEL WITH GFR
ALBUMIN: 4.3 g/dL (ref 3.6–5.1)
ALT: 42 U/L — AB (ref 6–29)
AST: 36 U/L — AB (ref 10–30)
Alkaline Phosphatase: 43 U/L (ref 33–115)
BUN: 18 mg/dL (ref 7–25)
CHLORIDE: 104 mmol/L (ref 98–110)
CO2: 26 mmol/L (ref 20–31)
Calcium: 9.1 mg/dL (ref 8.6–10.2)
Creat: 0.84 mg/dL (ref 0.50–1.10)
GFR, Est African American: >89 mL/min (ref >=60)
GFR, Est Non African American: 89 mL/min (ref >=60)
GLUCOSE: 102 mg/dL — AB (ref 70–99)
POTASSIUM: 4.4 mmol/L (ref 3.5–5.3)
SODIUM: 137 mmol/L (ref 135–146)
Total Bilirubin: 0.4 mg/dL (ref 0.2–1.2)
Total Protein: 6.4 g/dL (ref 6.1–8.1)

## 2015-06-02 LAB — CBC WITH DIFFERENTIAL/PLATELET
BASOS ABS: 0 10*3/uL (ref 0.0–0.1)
BASOS PCT: 0 % (ref 0–1)
Eosinophils Absolute: 0.2 10*3/uL (ref 0.0–0.7)
Eosinophils Relative: 4 % (ref 0–5)
HEMATOCRIT: 40.7 % (ref 36.0–46.0)
HEMOGLOBIN: 13.6 g/dL (ref 12.0–15.0)
LYMPHS PCT: 24 % (ref 12–46)
Lymphs Abs: 1.3 10*3/uL (ref 0.7–4.0)
MCH: 29 pg (ref 26.0–34.0)
MCHC: 33.4 g/dL (ref 30.0–36.0)
MCV: 86.8 fL (ref 78.0–100.0)
MPV: 11.4 fL (ref 8.6–12.4)
Monocytes Absolute: 0.3 10*3/uL (ref 0.1–1.0)
Monocytes Relative: 6 % (ref 3–12)
NEUTROS ABS: 3.5 10*3/uL (ref 1.7–7.7)
NEUTROS PCT: 66 % (ref 43–77)
Platelets: 212 10*3/uL (ref 150–400)
RBC: 4.69 MIL/uL (ref 3.87–5.11)
RDW: 13.9 % (ref 11.5–15.5)
WBC: 5.3 10*3/uL (ref 4.0–10.5)

## 2015-06-02 LAB — CELIAC PANEL 10
Endomysial Screen: NEGATIVE
GLIADIN IGG: 2 U (ref <20)
Gliadin IgA: 2 Units (ref <20)
IgA: 47 mg/dL — ABNORMAL LOW (ref 69–380)
TISSUE TRANSGLUT AB: 1 U/mL (ref <6)
TISSUE TRANSGLUTAMINASE AB, IGA: 1 U/mL (ref <4)

## 2015-06-02 LAB — TSH: TSH: 1.432 u[IU]/mL (ref 0.350–4.500)

## 2015-06-02 NOTE — Progress Notes (Signed)
Patient ID: Robin Stout MRN: [id number], DOB: 1975/05/30, 40 y.o. Date of Encounter: @  Chief Complaint:  Chief Complaint  Patient presents with  . sick x 4 days    severe abd pain, diarrhea, eating makes worse, nausea     white  female  presents with :  She reports that she lost >150 lb in a year period of time with diet/exercise--had no surgery or procedure to assist with weight loss. Says on April 04, 2015 she had surgery to remove extra skin from her abdomen and her arms.  Says at 4 weeks post op she was fine.  Says at 5 weeks post op --the open wound was infected---put on Bactrim 160 BID x 10 days. Says infxn got better but she has been sick ever since. Ear infection. Then NoroVirus for 3 days---2 1/2 weeks ago---had vomiting with that virus, but no vomit since.  This past Sunday (Jan 29) she went out to eat --after that developed nausea.  On Monday, felt bad but went to work.  Every time she eats, has cramping and nausea. Having diarrhea--loose, but not watery.  Today--ate breakfast--then felt "a little bad". Ate lunch--then felt "horrible"--"horrible diarrhea, horrible cramps" Feels wiped out.  Eats very healthy.  Never had any GI issues prior to above.  Has never had any food intolerances etc.  Also says she is having horrible, malodorous gas---says has never had this in past.  No vomiting since NoroVirus.  Now--just the diarrhea, crampy pain, gas, feeling wiped out.   Past Medical History  Diagnosis Date  . Migraines      Home Meds: Outpatient Prescriptions Prior to Visit  Medication Sig Dispense Refill  . rizatriptan (MAXALT-MLT) 10 MG disintegrating tablet TAKE 1 TABLET BY MOUTH AS NEEDED FOR MIGRANE, MAY REPEAT IN 2 HOURS IF NEEDED 10 tablet 3  . promethazine (PHENERGAN) 25 MG tablet Take 1 tablet (25 mg total) by mouth every 8 (eight) hours as needed for nausea or vomiting. (Patient not taking: Reported on 06/01/2015) 20 tablet 0   No  facility-administered medications prior to visit.    Allergies:  Allergies  Allergen Reactions  . Penicillins Swelling    Social History   Social History  . Marital Status: Married    Spouse Name: N/A  . Number of Children: N/A  . Years of Education: N/A   Occupational History  . Not on file.   Social History Main Topics  . Smoking status: Never Smoker   . Smokeless tobacco: Not on file  . Alcohol Use: Yes     Comment: occasional  . Drug Use: No  . Sexual Activity: Not on file   Other Topics Concern  . Not on file   Social History Narrative    History reviewed. No pertinent family history.   Review of Systems:  See HPI for pertinent ROS. All other ROS negative.    Physical Exam: Blood pressure 102/64, pulse 64, temperature 99.4 F (37.4 C), temperature source Oral, resp. rate 18, weight 142 lb (64.411 kg)., Body mass index is 25.97 kg/(m^2). General: WNWD WF. Appears in no acute distress. Neck: Supple. No thyromegaly. No lymphadenopathy. Lungs: Clear bilaterally to auscultation without wheezes, rales, or rhonchi. Breathing is unlabored. Heart: RRR with S1 S2. No murmurs, rubs, or gallops. Abdomen: Soft, non-tender, non-distended with normoactive bowel sounds. No hepatomegaly. No rebound/guarding. No obvious abdominal masses. She has verticle scar down middle of abdomen. She has horizontal scar across lower abdomen. With palpation, there is  no area of tenderness. Musculoskeletal:  Strength and tone normal for age. Extremities/Skin: Warm and dry.  Neuro: Alert and oriented X 3. Moves all extremities spontaneously. Gait is normal. CNII-XII grossly in tact. Psych:  Responds to questions appropriately with a normal affect.     ASSESSMENT AND PLAN:  40 y.o. year old female with  1. Diarrhea, unspecified type Take Probiotic Daily. Rx Phenergan refilled to use PRN nausea. If diarrhea persists, use otc med for this. Will check labs then f/u with pt once results  obtained. If labs normal, consider Viberzi or Bentyl,or Levsin. - CBC with Differential/Platelet - COMPLETE METABOLIC PANEL WITH GFR - TSH - Celiac panel 10 - Ova and parasite examination - Stool culture; Future - Clostridium Difficile by PCR  2. Nausea without vomiting Take Probiotic Daily. Rx Phenergan refilled to use PRN nausea. If diarrhea persists, use otc med for this. Will check labs then f/u with pt once results obtained. If labs normal, consider Viberzi or Bentyl,or Levsin.  - CBC with Differential/Platelet - COMPLETE METABOLIC PANEL WITH GFR - TSH - Celiac panel 10 - Ova and parasite examination - Stool culture; Future  3. Flatulence Take Probiotic Daily. Rx Phenergan refilled to use PRN nausea. If diarrhea persists, use otc med for this. Will check labs then f/u with pt once results obtained. If labs normal, consider Viberzi or Bentyl,or Levsin.  - CBC with Differential/Platelet - COMPLETE METABOLIC PANEL WITH GFR - TSH - Celiac panel 10 - Ova and parasite examination - Stool culture; Future - Clostridium Difficile by PCR  4. Abdominal cramping Take Probiotic Daily. Rx Phenergan refilled to use PRN nausea. If diarrhea persists, use otc med for this. Will check labs then f/u with pt once results obtained. If labs normal, consider Viberzi or Bentyl,or Levsin.  - CBC with Differential/Platelet - COMPLETE METABOLIC PANEL WITH GFR - TSH - Celiac panel 10 - Ova and parasite examination - Stool culture; Future - Clostridium Difficile by PCR   - promethazine (PHENERGAN) 25 MG tablet; Take 1 tablet (25 mg total) by mouth every 8 (eight) hours as needed for nausea or vomiting.  Dispense: 20 tablet; Refill: 0   Signed, [address] [place], [place], [facility] 06/02/2015 8:48 AM

## 2015-06-03 LAB — CLOSTRIDIUM DIFFICILE BY PCR: Toxigenic C. Difficile by PCR: NOT DETECTED

## 2015-06-05 ENCOUNTER — Other Ambulatory Visit: Payer: Self-pay | Admitting: *Deleted

## 2015-06-05 LAB — OVA AND PARASITE EXAMINATION: OP: NONE SEEN

## 2015-06-05 MED ORDER — HYOSCYAMINE SULFATE ER 0.375 MG PO TB12
0.3750 mg | ORAL_TABLET | Freq: Two times a day (BID) | ORAL | Status: DC
Start: 2015-06-05 — End: 2016-06-10

## 2015-06-07 ENCOUNTER — Ambulatory Visit (INDEPENDENT_AMBULATORY_CARE_PROVIDER_SITE_OTHER): Payer: PRIVATE HEALTH INSURANCE | Admitting: Family Medicine

## 2015-06-07 ENCOUNTER — Encounter: Payer: Self-pay | Admitting: Family Medicine

## 2015-06-07 VITALS — BP 98/64 | HR 62 | Temp 99.2°F | Resp 14 | Ht 62.0 in | Wt 144.0 lb

## 2015-06-07 DIAGNOSIS — R1084 Generalized abdominal pain: Secondary | ICD-10-CM | POA: Diagnosis not present

## 2015-06-07 DIAGNOSIS — R197 Diarrhea, unspecified: Secondary | ICD-10-CM | POA: Diagnosis not present

## 2015-06-07 NOTE — Progress Notes (Signed)
Patient ID: Robin Stout, female   DOB: 1975-08-15, 40 y.o.   MRN: [id number]   Subjective:    Patient ID: Robin Stout, female    DOB: 03-01-1976, 40 y.o.   MRN: [id number]  Patient presents for Follow-up  patient here for recurrent episodes of abdominal pain diarrhea nausea vomiting. This all started a few weeks ago. She was evaluated in our office one week. She had stool cultures done last healing thinking that was mildly elevated LFTs. Her symptoms initially started after she completed a 10 day course of Bactrim as she had a superficial wound infection and skin surgery. She lost weight via diet and exercise.  She subsequently ate at some restaurant and then became sick however no other family members had any illness. Since then she has had episodes of lower abdominal pain associated with vomiting and diarrhea. Her pain has never subsided just eases off at times. She states worse towards the end of the evening. No particular that set it off. She has not had any fever. No blood in the stool. She does take the Phenergan as needed for nausea.    Review Of Systems:  GEN- denies fatigue, fever, weight loss,weakness, recent illness HEENT- denies eye drainage, change in vision, nasal discharge, CVS- denies chest pain, palpitations RESP- denies SOB, cough, wheeze ABD-+ N/V, +change in stools,+ abd pain GU- denies dysuria, hematuria, dribbling, incontinence MSK- denies joint pain, muscle aches, injury Neuro- denies headache, dizziness, syncope, seizure activity       Objective:    BP 98/64 mmHg  Pulse 62  Temp(Src) 99.2 F (37.3 C) (Oral)  Resp 14  Ht  (1.575 m)  Wt 144 lb (65.318 kg)  BMI 26.33 kg/m2 GEN- NAD, alert and oriented x3 HEENT- PERRL, EOMI, non injected sclera, pink conjunctiva, MMM, oropharynx clear CVS- RRR, no murmur RESP-CTAB ABD-NABS,soft,ND, mild TTP lower quadrants, no rebound, no guarding, incision d/c/i  Pulses- Radial  2+        Assessment & Plan:       Problem List Items Addressed This Visit    None    Visit Diagnoses    Generalized abdominal pain    -  Primary    persistant pain pain and diarrhea, recent skin infection, normal stool samples, mild transamnitis, >? gallbadder etliology, pancreas etiology, send for CT scan  Recheck LFT     Relevant Orders    CT Abdomen Pelvis W Contrast    Diarrhea, unspecified type        Relevant Orders    CBC with Differential/Platelet    Comprehensive metabolic panel    Lipase    CT Abdomen Pelvis W Contrast       Note: This dictation was prepared with Dragon dictation along with smaller phrase technology. Any transcriptional errors that result from this process are unintentional.

## 2015-06-07 NOTE — Patient Instructions (Signed)
CT scan to be done Contrast given We will call with lab results F/U pending results

## 2015-06-08 ENCOUNTER — Other Ambulatory Visit: Payer: Self-pay | Admitting: Family Medicine

## 2015-06-08 ENCOUNTER — Ambulatory Visit
Admission: RE | Admit: 2015-06-08 | Discharge: 2015-06-08 | Disposition: A | Discharge status: Home / Self Care | Payer: PRIVATE HEALTH INSURANCE | Source: Ambulatory Visit | Attending: Family Medicine | Admitting: Family Medicine

## 2015-06-08 ENCOUNTER — Telehealth: Payer: Self-pay | Admitting: *Deleted

## 2015-06-08 DIAGNOSIS — R109 Unspecified abdominal pain: Secondary | ICD-10-CM

## 2015-06-08 DIAGNOSIS — R1084 Generalized abdominal pain: Secondary | ICD-10-CM

## 2015-06-08 DIAGNOSIS — R197 Diarrhea, unspecified: Secondary | ICD-10-CM

## 2015-06-08 LAB — COMPREHENSIVE METABOLIC PANEL
ALBUMIN: 4.3 g/dL (ref 3.6–5.1)
ALT: 37 U/L — ABNORMAL HIGH (ref 6–29)
AST: 22 U/L (ref 10–30)
Alkaline Phosphatase: 43 U/L (ref 33–115)
BUN: 18 mg/dL (ref 7–25)
CHLORIDE: 101 mmol/L (ref 98–110)
CO2: 29 mmol/L (ref 20–31)
Calcium: 9.1 mg/dL (ref 8.6–10.2)
Creat: 0.69 mg/dL (ref 0.50–1.10)
Glucose, Bld: 77 mg/dL (ref 70–99)
POTASSIUM: 4.2 mmol/L (ref 3.5–5.3)
Sodium: 138 mmol/L (ref 135–146)
TOTAL PROTEIN: 6.5 g/dL (ref 6.1–8.1)
Total Bilirubin: 0.4 mg/dL (ref 0.2–1.2)

## 2015-06-08 LAB — CBC WITH DIFFERENTIAL/PLATELET
BASOS PCT: 0 % (ref 0–1)
Basophils Absolute: 0 10*3/uL (ref 0.0–0.1)
EOS ABS: 0.8 10*3/uL — AB (ref 0.0–0.7)
Eosinophils Relative: 12 % — ABNORMAL HIGH (ref 0–5)
HCT: 41.6 % (ref 36.0–46.0)
HEMOGLOBIN: 13.4 g/dL (ref 12.0–15.0)
LYMPHS ABS: 1.8 10*3/uL (ref 0.7–4.0)
LYMPHS PCT: 28 % (ref 12–46)
MCH: 27.6 pg (ref 26.0–34.0)
MCHC: 32.2 g/dL (ref 30.0–36.0)
MCV: 85.6 fL (ref 78.0–100.0)
MONO ABS: 0.3 10*3/uL (ref 0.1–1.0)
MONOS PCT: 5 % (ref 3–12)
MPV: 11.4 fL (ref 8.6–12.4)
NEUTROS ABS: 3.6 10*3/uL (ref 1.7–7.7)
NEUTROS PCT: 55 % (ref 43–77)
PLATELETS: 188 10*3/uL (ref 150–400)
RBC: 4.86 MIL/uL (ref 3.87–5.11)
RDW: 13.8 % (ref 11.5–15.5)
WBC: 6.6 10*3/uL (ref 4.0–10.5)

## 2015-06-08 LAB — LIPASE: LIPASE: 54 U/L (ref 7–60)

## 2015-06-08 MED ORDER — IOPAMIDOL (ISOVUE-300) INJECTION 61%
100.0000 mL | Freq: Once | INTRAVENOUS | Status: AC | PRN
  Administered 2015-06-08: 100 mL via INTRAVENOUS

## 2015-06-08 NOTE — Telephone Encounter (Signed)
Contacted pt informing her of her appt to have CT scan of Abd/Pelvis at 5pm today at [facility], pt wanted to go to [facility] instead of [place] [facility] but no appts until 7pm tonight vs [place] [facility] at 5pm, pt states she is ok with going to [place] [facility], however states she has gotten worse over night and wanted to know if Dr. Jeanice Lim could let her know of the results ASAP once it was done. I sent message to Dr. Jeanice Lim letting her know of the CT scan is later today and pt wanted results asap, she replied back stating if the scan was that late in the day then they will not be back until morning and to advise her to go to ED if her pain is severe and the hospital can do CT scan there. I called and gave this information to pt and she stated that right now she is somewhat better since she had eat something but still nauseous but pain had stopped for now. Pt stated if pain comes back and worse then she will go directly to ED for evaluation but will try to wait until her appointment.

## 2015-06-16 ENCOUNTER — Encounter: Payer: Self-pay | Admitting: Internal Medicine

## 2015-06-16 ENCOUNTER — Telehealth: Payer: Self-pay | Admitting: Family Medicine

## 2015-06-16 DIAGNOSIS — R1084 Generalized abdominal pain: Secondary | ICD-10-CM

## 2015-06-16 DIAGNOSIS — R197 Diarrhea, unspecified: Secondary | ICD-10-CM

## 2015-06-16 NOTE — Telephone Encounter (Signed)
Patient is calling to say she is not better and would like referral to a gastro doc if possible  [phone]

## 2015-06-16 NOTE — Telephone Encounter (Signed)
Pt called stating is still having pain and diarrhea and would like to be referred to [facility]. Referral placed

## 2015-06-20 ENCOUNTER — Ambulatory Visit (INDEPENDENT_AMBULATORY_CARE_PROVIDER_SITE_OTHER): Payer: PRIVATE HEALTH INSURANCE | Admitting: Gastroenterology

## 2015-06-20 ENCOUNTER — Encounter: Payer: Self-pay | Admitting: Gastroenterology

## 2015-06-20 VITALS — BP 97/60 | HR 68 | Temp 98.3°F | Ht 62.0 in | Wt 148.0 lb

## 2015-06-20 DIAGNOSIS — R197 Diarrhea, unspecified: Secondary | ICD-10-CM | POA: Diagnosis not present

## 2015-06-20 NOTE — Progress Notes (Signed)
Gastroenterology Consultation  Referring Provider:     Donita Brooks, MD Primary Care Physician:  Leo Grosser, MD Primary Gastroenterologist:  Dr. Servando Snare     Reason for Consultation:     Change in bowel habits        HPI:   [facility] Robin Stout is a 40 y.o. y/o female referred for consultation & management of change in bowel habits by Dr. Lynnea Ferrier TOM, MD.  This patient comes today with a report of having lost 154 pounds from working out and watching her weight. The patient was then seen by plastic surgery at [facility] and had an abdominoplasty with excess skin and tissue removed. The patient had an infection of the area below the umbilicus at that time and was given multiple course of antibiotics. Since then the patient has had problems with her bowel movements. The patient states that she typically has a lot of diarrhea and then went to see her doctor who did an x-ray and found out that she had a colon full of stool. The patient was given multiple laxatives until she was in the toilet at her work in severe pain. The patient then stop the MiraLAX and started to have a lot of constipation. She also reports that she has a lot of flatulence with very foul-smelling stools. She reports that she eats [place] yogurt daily. She also chews a lot of gum and drinks frequent hot drinks. The patient also reports that the flatulence has been a recent problem.  Past Medical History  Diagnosis Date  . Migraines     Past Surgical History  Procedure Laterality Date  . Abdominal hysterectomy    . Appendectomy    . Cesarean section    . Abdominal plasty  03/2015    Prior to Admission medications   Medication Sig Start Date End Date Taking? Authorizing Provider  promethazine (PHENERGAN) 25 MG tablet Take 1 tablet (25 mg total) by mouth every 8 (eight) hours as needed for nausea or vomiting. 06/01/15  Yes Mary B Dixon, PA-C  rizatriptan (MAXALT-MLT) 10 MG disintegrating tablet TAKE 1  TABLET BY MOUTH AS NEEDED FOR MIGRANE, MAY REPEAT IN 2 HOURS IF NEEDED 07/04/14  Yes Donita Brooks, MD  hyoscyamine (LEVBID) 0.375 MG 12 hr tablet Take 1 tablet (0.375 mg total) by mouth 2 (two) times daily. Patient not taking: Reported on 06/20/2015 06/05/15   Dorena Bodo, PA-C    Family History  Problem Relation Age of Onset  . Diabetes Father   . Heart disease Father   . Hypertension Father   . Stroke Father   . Multiple sclerosis Mother      Social History  Substance Use Topics  . Smoking status: Never Smoker   . Smokeless tobacco: Never Used  . Alcohol Use: 0.0 oz/week    0 Standard drinks or equivalent per week     Comment: occasional    Allergies as of 06/20/2015 - Review Complete 06/20/2015  Allergen Reaction Noted  . Penicillins Swelling 01/22/2013    Review of Systems:    All systems reviewed and negative except where noted in HPI.   Physical Exam:  BP 97/60 mmHg  Pulse 68  Temp(Src) 98.3 F (36.8 C) (Oral)  Ht  (1.575 m)  Wt 148 lb (67.132 kg)  BMI 27.06 kg/m2 No LMP recorded. Patient has had a hysterectomy. Psych:  Alert and cooperative. Normal mood and affect. General:   Alert,  Well-developed, well-nourished, pleasant and  cooperative in NAD Head:  Normocephalic and atraumatic. Eyes:  Sclera clear, no icterus.   Conjunctiva pink. Ears:  Normal auditory acuity. Nose:  No deformity, discharge, or lesions. Mouth:  No deformity or lesions,oropharynx pink & moist. Neck:  Supple; no masses or thyromegaly. Lungs:  Respirations even and unlabored.  Clear throughout to auscultation.   No wheezes, crackles, or rhonchi. No acute distress. Heart:  Regular rate and rhythm; no murmurs, clicks, rubs, or gallops. Abdomen:  Normal bowel sounds.  No bruits.  Soft, non-tender and non-distended without masses, hepatosplenomegaly or hernias noted.  No guarding or rebound tenderness.  Negative Carnett sign.   Rectal:  Deferred.  Msk:  Symmetrical without gross  deformities.  Good, equal movement & strength bilaterally. Pulses:  Normal pulses noted. Extremities:  No clubbing or edema.  No cyanosis. Neurologic:  Alert and oriented x3;  grossly normal neurologically. Skin:  Intact without significant lesions or rashes.  No jaundice. Lymph Nodes:  No significant cervical adenopathy. Psych:  Alert and cooperative. Normal mood and affect.  Imaging Studies: Ct Abdomen Pelvis W Contrast  06/09/2015  CLINICAL DATA:  Generalized abdominal pain. Diarrhea and vomiting for 2 weeks. EXAM: CT ABDOMEN AND PELVIS WITH CONTRAST TECHNIQUE: Multidetector CT imaging of the abdomen and pelvis was performed using the standard protocol following bolus administration of intravenous contrast. CONTRAST:  ISOVUE-300 IOPAMIDOL (ISOVUE-300) INJECTION 61% COMPARISON:  07/21/2008, 09/14/2009 FINDINGS: Lower chest:  Clear lung bases.  Normal heart size. Hepatobiliary: Normal liver.  Normal gallbladder. Pancreas: Normal. Spleen: Normal. Adrenals/Urinary Tract: Normal adrenal glands. Normal kidneys. No urolithiasis or obstructive uropathy. Normal bladder. Stomach/Bowel: No bowel wall thickening or dilatation. Large amount of stool throughout the colon. No pneumatosis, pneumoperitoneum or portal venous gas. No abdominal or pelvic free fluid. Vascular/Lymphatic: Normal caliber abdominal aorta. No lymphadenopathy. Reproductive: Prior hysterectomy.  No adnexal mass. Other: No fluid collection or hematoma. Postsurgical changes in the anterior abdominal wall. Musculoskeletal: No acute osseous abnormality. No lytic or sclerotic osseous lesion. IMPRESSION: 1. No acute abdominal or pelvic pathology. 2. Large amount of stool throughout the colon. Electronically Signed   By: Elige Ko   On: 06/09/2015 08:06    Assessment and Plan:   Robin Stout is a 40 y.o. 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 The patient had an episode of constipation with overflow  diarrhea. The patient has been given a bowel prep to try and clean her entire colon out. The patient will then start on VSL #3 after she takes the prep to try and recolonize her bowels with good bacteria. She has also been told that if she does not have regular bowel movements with the probiotic she should start Citrucel. The patient will stay in contact with our office to see how she progresses. Patient and her husband have been explained the plan and agree with it.   Note: This dictation was prepared with Dragon dictation along with smaller phrase technology. Any transcriptional errors that result from this process are unintentional.

## 2015-07-06 ENCOUNTER — Encounter: Payer: Self-pay | Admitting: *Deleted

## 2015-07-11 ENCOUNTER — Ambulatory Visit: Payer: Self-pay | Admitting: Internal Medicine

## 2015-07-27 ENCOUNTER — Ambulatory Visit: Payer: Self-pay | Admitting: Gastroenterology

## 2015-07-31 ENCOUNTER — Other Ambulatory Visit: Payer: Self-pay | Admitting: Family Medicine

## 2015-12-06 ENCOUNTER — Other Ambulatory Visit: Payer: Self-pay | Admitting: Family Medicine

## 2015-12-06 NOTE — Telephone Encounter (Signed)
Refill appropriate and filled per protocol. 

## 2016-03-20 ENCOUNTER — Other Ambulatory Visit: Payer: Self-pay | Admitting: Family Medicine

## 2016-05-27 ENCOUNTER — Emergency Department (HOSPITAL_BASED_OUTPATIENT_CLINIC_OR_DEPARTMENT_OTHER)
Admission: EM | Admit: 2016-05-27 | Discharge: 2016-05-28 | Disposition: A | Discharge status: Home / Self Care | Payer: No Typology Code available for payment source | Attending: Emergency Medicine | Admitting: Emergency Medicine

## 2016-05-27 ENCOUNTER — Encounter (HOSPITAL_BASED_OUTPATIENT_CLINIC_OR_DEPARTMENT_OTHER): Payer: Self-pay | Admitting: Emergency Medicine

## 2016-05-27 DIAGNOSIS — Y92410 Unspecified street and highway as the place of occurrence of the external cause: Secondary | ICD-10-CM | POA: Insufficient documentation

## 2016-05-27 DIAGNOSIS — Y9389 Activity, other specified: Secondary | ICD-10-CM | POA: Insufficient documentation

## 2016-05-27 DIAGNOSIS — S0990XA Unspecified injury of head, initial encounter: Secondary | ICD-10-CM | POA: Diagnosis not present

## 2016-05-27 DIAGNOSIS — Y999 Unspecified external cause status: Secondary | ICD-10-CM | POA: Diagnosis not present

## 2016-05-27 MED ORDER — ONDANSETRON 4 MG PO TBDP
4.0000 mg | ORAL_TABLET | Freq: Once | ORAL | Status: AC
  Administered 2016-05-28: 4 mg via ORAL
  Filled 2016-05-27: qty 1

## 2016-05-27 MED ORDER — IBUPROFEN 800 MG PO TABS
800.0000 mg | ORAL_TABLET | Freq: Once | ORAL | Status: AC
  Administered 2016-05-27: 800 mg via ORAL
  Filled 2016-05-27: qty 1
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
## 2016-05-27 NOTE — ED Notes (Addendum)
pts husband states she is having a headache. Tylenol or Motrin offered.

## 2016-05-28 ENCOUNTER — Other Ambulatory Visit: Payer: Self-pay | Admitting: Family Medicine

## 2016-05-28 ENCOUNTER — Emergency Department (HOSPITAL_BASED_OUTPATIENT_CLINIC_OR_DEPARTMENT_OTHER): Payer: No Typology Code available for payment source

## 2016-05-28 MED ORDER — IBUPROFEN 800 MG PO TABS: 800.0000 mg | ORAL_TABLET | Freq: Three times a day (TID) | ORAL | 0 refills | Status: DC

## 2016-05-28 MED ORDER — ONDANSETRON HCL 4 MG PO TABS: 4.0000 mg | ORAL_TABLET | Freq: Four times a day (QID) | ORAL | 0 refills | Status: DC

## 2016-05-28 NOTE — Discharge Instructions (Signed)
Avoid contact sports. Follow up with your doctor. Return to the ED if you develop new or worsening symptoms.

## 2016-05-28 NOTE — Telephone Encounter (Signed)
Rx filled per protocol  

## 2016-06-04 ENCOUNTER — Ambulatory Visit (HOSPITAL_COMMUNITY)
Admission: RE | Admit: 2016-06-04 | Discharge: 2016-06-04 | Disposition: A | Discharge status: Home / Self Care | Payer: PRIVATE HEALTH INSURANCE | Source: Ambulatory Visit | Attending: Internal Medicine | Admitting: Internal Medicine

## 2016-06-04 ENCOUNTER — Other Ambulatory Visit (HOSPITAL_COMMUNITY): Payer: Self-pay | Admitting: Internal Medicine

## 2016-06-04 DIAGNOSIS — X58XXXA Exposure to other specified factors, initial encounter: Secondary | ICD-10-CM | POA: Insufficient documentation

## 2016-06-04 DIAGNOSIS — R410 Disorientation, unspecified: Secondary | ICD-10-CM | POA: Diagnosis present

## 2016-06-04 DIAGNOSIS — S0990XA Unspecified injury of head, initial encounter: Secondary | ICD-10-CM | POA: Insufficient documentation

## 2016-06-10 ENCOUNTER — Encounter: Payer: Self-pay | Admitting: Neurology

## 2016-06-10 ENCOUNTER — Ambulatory Visit (INDEPENDENT_AMBULATORY_CARE_PROVIDER_SITE_OTHER): Payer: Self-pay | Admitting: Neurology

## 2016-06-10 VITALS — BP 108/68 | HR 64 | Resp 14 | Ht 62.0 in | Wt 149.0 lb

## 2016-06-10 DIAGNOSIS — F0781 Postconcussional syndrome: Secondary | ICD-10-CM

## 2016-06-10 DIAGNOSIS — M542 Cervicalgia: Secondary | ICD-10-CM

## 2016-06-10 DIAGNOSIS — S0990XA Unspecified injury of head, initial encounter: Secondary | ICD-10-CM

## 2016-06-10 DIAGNOSIS — G43009 Migraine without aura, not intractable, without status migrainosus: Secondary | ICD-10-CM

## 2016-06-10 DIAGNOSIS — G4459 Other complicated headache syndrome: Secondary | ICD-10-CM | POA: Insufficient documentation

## 2016-06-10 MED ORDER — IMIPRAMINE HCL 25 MG PO TABS: ORAL_TABLET | ORAL | 3 refills | Status: DC

## 2016-06-10 MED ORDER — PROMETHAZINE HCL 25 MG PO TABS: 25.0000 mg | ORAL_TABLET | Freq: Three times a day (TID) | ORAL | 0 refills | Status: DC | PRN
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
## 2016-07-04 ENCOUNTER — Encounter (INDEPENDENT_AMBULATORY_CARE_PROVIDER_SITE_OTHER): Payer: Self-pay

## 2016-07-04 ENCOUNTER — Ambulatory Visit (INDEPENDENT_AMBULATORY_CARE_PROVIDER_SITE_OTHER): Payer: PRIVATE HEALTH INSURANCE | Admitting: Neurology

## 2016-07-04 ENCOUNTER — Encounter: Payer: Self-pay | Admitting: Neurology

## 2016-07-04 VITALS — BP 114/82 | HR 66 | Resp 16 | Ht 62.0 in | Wt 152.0 lb

## 2016-07-04 DIAGNOSIS — F0781 Postconcussional syndrome: Secondary | ICD-10-CM

## 2016-07-04 DIAGNOSIS — S0990XD Unspecified injury of head, subsequent encounter: Secondary | ICD-10-CM | POA: Diagnosis not present

## 2016-07-04 DIAGNOSIS — G43809 Other migraine, not intractable, without status migrainosus: Secondary | ICD-10-CM

## 2016-07-04 DIAGNOSIS — G250 Essential tremor: Secondary | ICD-10-CM | POA: Diagnosis not present

## 2016-07-04 MED ORDER — PROPRANOLOL HCL ER 60 MG PO CP24: 60.0000 mg | ORAL_CAPSULE | Freq: Every day | ORAL | 5 refills | Status: DC
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
## 2016-07-04 NOTE — Patient Instructions (Signed)
Avoid strenuous activity until headache improves.  Magnesium (200-250 mg twice daily) and riboflavin (vit B2; 200 mg twice a day) may help the headache  Add Inderal  If not better in 3 weeks call us

## 2016-07-08 ENCOUNTER — Telehealth: Payer: Self-pay | Admitting: Neurology

## 2016-07-08 MED ORDER — PRIMIDONE 50 MG PO TABS: 50.0000 mg | ORAL_TABLET | Freq: Every day | ORAL | 2 refills | Status: DC

## 2016-07-08 NOTE — Telephone Encounter (Signed)
I called the patient. She is not tolerating the Inderal 60 mg LA capsule due to severe fatigue. We will stop the medication, and when she feels better, we will start mysoline. She is being treated for an essential tremor.

## 2016-08-14 ENCOUNTER — Ambulatory Visit (INDEPENDENT_AMBULATORY_CARE_PROVIDER_SITE_OTHER): Payer: PRIVATE HEALTH INSURANCE | Admitting: Neurology

## 2016-08-14 ENCOUNTER — Encounter: Payer: Self-pay | Admitting: Neurology

## 2016-08-14 VITALS — BP 139/75 | Resp 14 | Ht 62.0 in | Wt 153.0 lb

## 2016-08-14 DIAGNOSIS — IMO0002 Reserved for concepts with insufficient information to code with codable children: Secondary | ICD-10-CM

## 2016-08-14 DIAGNOSIS — G250 Essential tremor: Secondary | ICD-10-CM | POA: Diagnosis not present

## 2016-08-14 DIAGNOSIS — F0781 Postconcussional syndrome: Secondary | ICD-10-CM | POA: Diagnosis not present

## 2016-08-14 DIAGNOSIS — G43709 Chronic migraine without aura, not intractable, without status migrainosus: Secondary | ICD-10-CM

## 2016-08-14 MED ORDER — PRIMIDONE 50 MG PO TABS: 100.0000 mg | ORAL_TABLET | Freq: Every day | ORAL | 5 refills | Status: DC

## 2016-08-14 MED ORDER — TOPIRAMATE 50 MG PO TABS: 50.0000 mg | ORAL_TABLET | Freq: Two times a day (BID) | ORAL | 5 refills | Status: DC
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
## 2016-08-14 NOTE — Patient Instructions (Signed)
Riboflavin (Vit B2) 200 mg twice daily Magnesium (200-250 mg) twice daily  Increase primidone to 2 pills at bedtime Topiramate 50 mg ----  One pill at bedtime x 1 week, if well tolerated can go up to 2 pills at bedtime

## 2016-10-21 ENCOUNTER — Ambulatory Visit: Payer: PRIVATE HEALTH INSURANCE | Admitting: Neurology

## 2017-01-10 ENCOUNTER — Encounter: Payer: Self-pay | Admitting: Physical Medicine & Rehabilitation

## 2017-02-17 ENCOUNTER — Encounter: Payer: Self-pay | Admitting: Physical Medicine & Rehabilitation

## 2017-02-17 ENCOUNTER — Encounter
Payer: PRIVATE HEALTH INSURANCE | Attending: Physical Medicine & Rehabilitation | Admitting: Physical Medicine & Rehabilitation

## 2017-02-17 VITALS — BP 96/64 | HR 82

## 2017-02-17 DIAGNOSIS — IMO0002 Reserved for concepts with insufficient information to code with codable children: Secondary | ICD-10-CM

## 2017-02-17 DIAGNOSIS — G43709 Chronic migraine without aura, not intractable, without status migrainosus: Secondary | ICD-10-CM

## 2017-02-17 DIAGNOSIS — F0781 Postconcussional syndrome: Secondary | ICD-10-CM | POA: Diagnosis present

## 2017-02-17 DIAGNOSIS — F4310 Post-traumatic stress disorder, unspecified: Secondary | ICD-10-CM | POA: Insufficient documentation

## 2017-02-17 DIAGNOSIS — F329 Major depressive disorder, single episode, unspecified: Secondary | ICD-10-CM | POA: Diagnosis not present

## 2017-02-17 DIAGNOSIS — H8110 Benign paroxysmal vertigo, unspecified ear: Secondary | ICD-10-CM | POA: Insufficient documentation

## 2017-02-17 DIAGNOSIS — G43909 Migraine, unspecified, not intractable, without status migrainosus: Secondary | ICD-10-CM | POA: Insufficient documentation

## 2017-02-17 DIAGNOSIS — X58XXXS Exposure to other specified factors, sequela: Secondary | ICD-10-CM | POA: Diagnosis not present

## 2017-02-17 MED ORDER — TRAZODONE HCL 50 MG PO TABS: 50.0000 mg | ORAL_TABLET | Freq: Every day | ORAL | 5 refills | Status: DC

## 2017-02-17 MED ORDER — TOPIRAMATE ER 100 MG PO CAP24: 100.0000 mg | ORAL_CAPSULE | Freq: Every day | ORAL | 4 refills | Status: DC

## 2017-02-17 NOTE — Patient Instructions (Signed)
PLEASE FEEL FREE TO CALL OUR OFFICE WITH ANY PROBLEMS OR QUESTIONS [phone])     WORK ON REGULAR SLEEP HYGIENE AND A ROUTINE. TRY TO [employer] 9PM SLEEP TIME GIVEN THAT YOU WAKE UP AT 0500
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
## 2017-02-20 ENCOUNTER — Encounter: Payer: Self-pay | Admitting: Psychology

## 2017-02-20 ENCOUNTER — Encounter (HOSPITAL_BASED_OUTPATIENT_CLINIC_OR_DEPARTMENT_OTHER): Payer: PRIVATE HEALTH INSURANCE | Admitting: Psychology

## 2017-02-20 DIAGNOSIS — H8110 Benign paroxysmal vertigo, unspecified ear: Secondary | ICD-10-CM | POA: Diagnosis not present

## 2017-02-20 DIAGNOSIS — F4310 Post-traumatic stress disorder, unspecified: Secondary | ICD-10-CM

## 2017-02-20 DIAGNOSIS — G43709 Chronic migraine without aura, not intractable, without status migrainosus: Secondary | ICD-10-CM | POA: Diagnosis not present

## 2017-02-20 DIAGNOSIS — F0781 Postconcussional syndrome: Secondary | ICD-10-CM | POA: Diagnosis not present

## 2017-02-20 DIAGNOSIS — F329 Major depressive disorder, single episode, unspecified: Secondary | ICD-10-CM | POA: Diagnosis not present

## 2017-02-20 DIAGNOSIS — IMO0002 Reserved for concepts with insufficient information to code with codable children: Secondary | ICD-10-CM
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
## 2017-03-04 ENCOUNTER — Encounter: Payer: Self-pay | Admitting: Speech Pathology

## 2017-03-04 ENCOUNTER — Ambulatory Visit: Payer: PRIVATE HEALTH INSURANCE | Admitting: Speech Pathology

## 2017-03-04 ENCOUNTER — Ambulatory Visit: Payer: PRIVATE HEALTH INSURANCE | Attending: Physical Medicine & Rehabilitation | Admitting: Physical Therapy

## 2017-03-04 ENCOUNTER — Encounter: Payer: Self-pay | Admitting: Physical Therapy

## 2017-03-04 DIAGNOSIS — R262 Difficulty in walking, not elsewhere classified: Secondary | ICD-10-CM | POA: Diagnosis present

## 2017-03-04 DIAGNOSIS — R2681 Unsteadiness on feet: Secondary | ICD-10-CM

## 2017-03-04 DIAGNOSIS — R41841 Cognitive communication deficit: Secondary | ICD-10-CM | POA: Insufficient documentation

## 2017-03-04 DIAGNOSIS — R29818 Other symptoms and signs involving the nervous system: Secondary | ICD-10-CM | POA: Diagnosis present

## 2017-03-04 DIAGNOSIS — R42 Dizziness and giddiness: Secondary | ICD-10-CM | POA: Insufficient documentation
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
## 2017-03-07 ENCOUNTER — Ambulatory Visit: Payer: PRIVATE HEALTH INSURANCE | Admitting: Rehabilitative and Restorative Service Providers"

## 2017-03-07 DIAGNOSIS — R2681 Unsteadiness on feet: Secondary | ICD-10-CM

## 2017-03-07 DIAGNOSIS — R42 Dizziness and giddiness: Secondary | ICD-10-CM

## 2017-03-07 DIAGNOSIS — R29818 Other symptoms and signs involving the nervous system: Secondary | ICD-10-CM

## 2017-03-07 DIAGNOSIS — R262 Difficulty in walking, not elsewhere classified: Secondary | ICD-10-CM

## 2017-03-07 NOTE — Patient Instructions (Signed)
Gaze Stabilization - Tip Card  1.Target must remain in focus, not blurry, and appear stationary while head is in motion. 2.Perform exercises with small head movements (45 to either side of midline). 3.Increase speed of head motion so long as target is in focus. 4.If you wear eyeglasses, be sure you can see target through lens (therapist will give specific instructions for bifocal / progressive lenses). 5.These exercises may provoke dizziness or nausea. Work through these symptoms. If too dizzy, slow head movement slightly. Rest between each exercise. 6.Exercises demand concentration; avoid distractions. 7.For safety, perform standing exercises close to a counter, wall, corner, or next to someone.  Copyright  VHI. All rights reserved.   Gaze Stabilization - Standing Feet Apart   Feet shoulder width apart, keeping eyes on target on wall 3 feet away, tilt head down slightly and move head side to side for 30 seconds.  *Work up to tolerating 60 seconds, as able. Do 2-3 sessions per day.  Weight Shift: Anterior / Posterior (Limits of Stability)    LEAN against a wall with upper back/shoulders touching the wall.  Then perform the above letter exercise for 30 seconds.  2 times/day.     Copyright  VHI. All rights reserved.   Feet Apart, Head Motion - Eyes Open    With eyes open, feet apart, move head slowly: up and down.  SPOT OBJECTS:  Find a spot on ceiling and then the ground.  MOVE YOUR NOSE TO POINT TO THE OBJECT.  Repeat _3___ times per session. Do __2__ sessions per day.  Copyright  VHI. All rights reserved.     Extensors, Supine    Lie supine, head on small, rolled towel or one pillow.  Gently tuck chin and bring toward chest. Hold __5_ seconds. Repeat __10_ times per session. Do _2__ sessions per day.  Copyright  VHI. All rights reserved.    Special Instructions: Exercises may bring on mild to moderate symptoms of dizziness, mild head ache, eye strain, nausea.   These should resolve within 20 minutes of completing exercises. If symptoms are lasting longer than 20 minutes, modify your exercises by:  >decreasing the # of times you complete each activity >ensuring your symptoms return to baseline before moving onto the next exercise >dividing up exercises so you do not do them all in one session, but multiple short sessions throughout the day >doing them once a day until symptoms improve
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
## 2017-03-10 ENCOUNTER — Encounter: Payer: Self-pay | Admitting: Physical Therapy

## 2017-03-10 ENCOUNTER — Ambulatory Visit: Payer: PRIVATE HEALTH INSURANCE | Admitting: Physical Therapy

## 2017-03-10 DIAGNOSIS — R2681 Unsteadiness on feet: Secondary | ICD-10-CM

## 2017-03-10 DIAGNOSIS — R42 Dizziness and giddiness: Secondary | ICD-10-CM

## 2017-03-10 NOTE — Patient Instructions (Signed)
Feet Apart (Compliant Surface) Head Motion - Eyes Closed    Stand on compliant surface: ___pillow_____ with feet shoulder width apart. Close eyes and move head slowly, up and down. Repeat _2_ times per session. Do 1_ sessions per day.  Copyright  VHI. All rights reserved.  Feet Apart (Compliant Surface) Head Motion - Eyes Open    With eyes open, standing on compliant surface: _pillow_______, feet shoulder width apart, move head slowly: up and down. Repeat __2__ times per session. Do __1__ sessions per day.  Copyright  VHI. All rights reserved.
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
## 2017-03-13 ENCOUNTER — Encounter: Payer: Self-pay | Admitting: Speech Pathology

## 2017-03-13 ENCOUNTER — Ambulatory Visit: Payer: PRIVATE HEALTH INSURANCE | Admitting: Speech Pathology

## 2017-03-13 DIAGNOSIS — R42 Dizziness and giddiness: Secondary | ICD-10-CM | POA: Diagnosis not present

## 2017-03-13 DIAGNOSIS — R41841 Cognitive communication deficit: Secondary | ICD-10-CM
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
## 2017-03-13 NOTE — Patient Instructions (Addendum)
   Tips to help facilitate better attention, concentration, focus   Do harder, longer tasks when you are most alert/awake  Break down larger tasks into small parts  Limit distractions of TV, radio, conversation, e mails/texts, appliance noise, etc - if a job is important, do it in a quiet room  Be aware of how you are functioning in high stimulation environments such as large stores, parties, restaurants - any place with lots of lights, noise, signs etc  Group conversations may be more difficult to process than one on one conversations  Give yourself extra time to process conversation, reading materials, directions or information from your healthcare providers  Organization is key - clutters of laundry, mail, paperwork, dirty dishes - all make it more difficult to concentrate  Before you start a task, have all the needed supplies, directions, recipes ready and organized. This way you don't have to go looking for something in the middle of a task and become distracted.   Be aware of fatigue - take rests or breaks when needed to re-group and re-focus   Memory Strategies  W - Write it down  A - Associate it with something  R - Repeat it  M - Mental Image     Play the memory game  Try to remember 3-5 items on your store list without looking  Study a detailed picture in a magazine for 1 minute, then write down everything you can remember from the picture  Make lists of tasks - keep notes of where you left off  List steps for each task  Consider office hours for the officers and let phone go to voice mail and e mail  Set a time for when you make phone calls  Organize your day and set priorities and write it down  Think about putting your foot down on non essential work activities - you can't do it all  Let people know you are having trouble with word finding, processing information, and attending/focusing

## 2017-03-19 ENCOUNTER — Other Ambulatory Visit: Payer: Self-pay

## 2017-03-19 ENCOUNTER — Encounter: Payer: Self-pay | Admitting: Physical Medicine & Rehabilitation

## 2017-03-19 ENCOUNTER — Encounter
Payer: PRIVATE HEALTH INSURANCE | Attending: Physical Medicine & Rehabilitation | Admitting: Physical Medicine & Rehabilitation

## 2017-03-19 VITALS — BP 103/66 | HR 76 | Resp 14

## 2017-03-19 DIAGNOSIS — G43709 Chronic migraine without aura, not intractable, without status migrainosus: Secondary | ICD-10-CM | POA: Diagnosis not present

## 2017-03-19 DIAGNOSIS — F4310 Post-traumatic stress disorder, unspecified: Secondary | ICD-10-CM

## 2017-03-19 DIAGNOSIS — H8110 Benign paroxysmal vertigo, unspecified ear: Secondary | ICD-10-CM | POA: Diagnosis not present

## 2017-03-19 DIAGNOSIS — F329 Major depressive disorder, single episode, unspecified: Secondary | ICD-10-CM | POA: Insufficient documentation

## 2017-03-19 DIAGNOSIS — G43909 Migraine, unspecified, not intractable, without status migrainosus: Secondary | ICD-10-CM | POA: Diagnosis not present

## 2017-03-19 DIAGNOSIS — F0781 Postconcussional syndrome: Secondary | ICD-10-CM

## 2017-03-19 DIAGNOSIS — IMO0002 Reserved for concepts with insufficient information to code with codable children: Secondary | ICD-10-CM

## 2017-03-19 MED ORDER — AMPHETAMINE-DEXTROAMPHET ER 5 MG PO CP24: 5.0000 mg | ORAL_CAPSULE | Freq: Every day | ORAL | 0 refills | Status: DC

## 2017-03-19 MED ORDER — TOPIRAMATE ER 50 MG PO CAP24: 150.0000 mg | ORAL_CAPSULE | Freq: Every day | ORAL | 3 refills | Status: DC

## 2017-03-19 NOTE — Progress Notes (Signed)
Subjective:    Patient ID: Robin Stout, female    DOB: 09/01/1975, 41 y.o.   MRN: [id number]  HPI  Robin Stout is here in follow up of her PCS and PTSD.  Her last visit we arranged outpatient therapy to address her vestibular symptoms as well as her higher level cognitive deficits.  She is already begun to see some progress.  Additionally I sent her to Dr. Arley PhenixJohn Stout for assessment of her coping skills and PTSD.  She found the session extremely helpful so far.  From a medication standpoint we initiated trazodone.  She started at 25 mg but is titrated up to 100 mg and this seems to have helped her sleep.  She may only wake up about 1 time at nighttime.  She is not having nightmares while on this medication either.  She is not having problems falling asleep as she states that she is quite ready to go to sleep each night at the end of the day.   She has separated from her husband and this is been quite difficult for her although she knew it had to happen.  Her teenage son is also been struggling with it.  She has had to take on some new responsibilities such as keeping up with the bills finding a house to live in etc.  She has begun to utilize some of the strategies taught to her in speech therapy for regarding her organization of information.  She finds it hard still as she was somewhat who tried to multitask frequently before.  Pain Inventory Average Pain 4 Pain Right Now 5 My pain is sharp, dull and aching  In the last 24 hours, has pain interfered with the following? General activity 6 Relation with others 5 Enjoyment of life 5 What TIME of day is your pain at its worst? morning, daytime, evening Sleep (in general) Good  Pain is worse with: some activites Pain improves with: rest and medication Relief from Meds: 7  Mobility walk without assistance ability to climb steps?  yes do you drive?  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  no  Physicians involved in your care Any changes since last visit?  no   Family History  Problem Relation Age of Onset  . Multiple sclerosis Mother   . Diabetes Father   . Heart disease Father   . Hypertension Father   . Stroke Father    Social History   Socioeconomic History  . Marital status: Married    Spouse name: None  . Number of children: None  . Years of education: None  . Highest education level: None  Social Needs  . Financial resource strain: None  . Food insecurity - worry: None  . Food insecurity - inability: None  . Transportation needs - medical: None  . Transportation needs - non-medical: None  Occupational History  . None  Tobacco Use  . Smoking status: Never Smoker  . Smokeless tobacco: Never Used  Substance and Sexual Activity  . Alcohol use: Yes    Alcohol/week: 0.0 oz    Comment: occasional  . Drug use: No  . Sexual activity: None  Other Topics Concern  . None  Social History Narrative  . None   Past Surgical History:  Procedure Laterality Date  .  ABDOMINAL HYSTERECTOMY    . abdominal plasty  03/2015  . APPENDECTOMY    . CESAREAN SECTION     Past Medical History:  Diagnosis Date  . Liver lesion   . Migraines   . Sigmoid diverticulum    BP 103/66 (BP Location: Left Arm, Patient Position: Sitting, Cuff Size: Normal)   Pulse 76   Resp 14   SpO2 96%   Opioid Risk Score:   Fall Risk Score:  `1  Depression screen PHQ 2/9  No flowsheet data found.  Review of Systems  Constitutional: Negative.   HENT: Negative.   Eyes: Positive for photophobia.  Respiratory: Negative.   Cardiovascular: Negative.   Gastrointestinal: Positive for nausea.  Endocrine: Negative.   Genitourinary: Negative.   Musculoskeletal: Positive for gait problem.    Skin: Negative.   Allergic/Immunologic: Negative.   Neurological: Positive for dizziness, tremors and headaches.  Hematological: Negative.   Psychiatric/Behavioral: Positive for confusion and dysphoric mood. The patient is nervous/anxious.   All other systems reviewed and are negative.      Objective:   Physical Exam  General: Patient is alert.  She has the lights turned off in the room because she is photophobic today. HEENT: Head is normocephalic, atraumatic, PERRLA, EOMI, sclera anicteric, oral mucosa pink and moist, dentition intact, ext ear canals clear.   Neck: Supple without JVD or lymphadenopathy Heart:  Regular rate Chest:  Normal effort  abdomen: Soft, non-tender, non-distended, bowel sounds positive. Extremities: No clubbing, cyanosis, or edema. Pulses are 2+ Skin: Clean and intact without signs of breakdown Neuro: Pt is cognitively appropriate with normal insight and awareness. Cranial nerves 2-12 are intact. Sensory exam is normal. Reflexes are 2+ in all 4's.    Gaze is conjugate.  I did not perform vestibular testing today.  She has mild intentional tremor still.  Motor exam is 5 out of 5 bilaterally.  Still has some troubles with concentration and focus but was functional for basic conversation today.   Musculoskeletal: Full ROM, No pain with AROM or PROM in the neck, trunk, or extremities. Posture appropriate Psych: Pt's affect is appropriate. Pt is cooperative. Became tearful at times when discussing her problems.        Assessment & Plan:  1. Postconcussion syndrome with persistent higher level cognitive deficits, vertigo, sleep disorder, balance deficits, and headaches.   2. Reactive depression/PTSD 3. History of migraines.      1. Continue trazodone 100mg  qhs.  This dosing seems to be helping her sleep quite a bit. 2.  Increase Trokendi XR 250 mg nightly for headaches 3.  Continue with  [facility] PT for vestibular assessment and treagtment 4.   Continue with [facility] SLP for higher level cognitive assessment and remediation.  We discussed some basic strategies for organizational skills today. 5. Follow up with  Dr. Arley PhenixJohn Stout for neuro-psych assessment and treatment of PTSD/reactive depression symptoms/coping skillst/etc.    6. Consider trial of antidepressant 7. Consider titration of primidone 8.  Given the multitude of things on her plate right now from her job to the divorce, I decided to move forward with the stimulant to help her with focus and concentration.  We will begin a trial of Adderall XL 5 mg daily. 9.  25 minutes was spent with this patient during examination and review of treatment plan. All questions were answered. I will follow up with her in about a month.

## 2017-03-19 NOTE — Patient Instructions (Signed)
TROKENDI:  USE THE REST OF YOUR 100MG  TABS WITH THE NEW 50MG  TO MAKE THE 150MG  DOSE

## 2017-03-24 ENCOUNTER — Telehealth: Payer: Self-pay

## 2017-03-24 NOTE — Telephone Encounter (Signed)
Recieved request for prior authorization for adderall xr 5mg , prior auth initiated today 03-24-17.

## 2017-03-25 ENCOUNTER — Other Ambulatory Visit: Payer: Self-pay

## 2017-03-25 ENCOUNTER — Ambulatory Visit: Payer: PRIVATE HEALTH INSURANCE | Admitting: Speech Pathology

## 2017-03-25 ENCOUNTER — Ambulatory Visit: Payer: PRIVATE HEALTH INSURANCE | Admitting: Physical Therapy

## 2017-03-25 ENCOUNTER — Encounter: Payer: Self-pay | Admitting: Speech Pathology

## 2017-03-25 DIAGNOSIS — R2681 Unsteadiness on feet: Secondary | ICD-10-CM

## 2017-03-25 DIAGNOSIS — R41841 Cognitive communication deficit: Secondary | ICD-10-CM

## 2017-03-25 DIAGNOSIS — R42 Dizziness and giddiness: Secondary | ICD-10-CM

## 2017-03-25 NOTE — Telephone Encounter (Signed)
In addition have also started a trokindi prior [place] today as well for this patient

## 2017-03-25 NOTE — Therapy (Signed)
[facility] [address] [place], [place], [address] Phone: [phone]   Fax:  [phone]  Speech Language Pathology Treatment  Patient Details  Name: Robin Stout MRN: [id number] Date of Birth: 1975-06-20 Referring Provider: Dr. Faith RogueZachary Swartz   Encounter Date: 03/25/2017  End of Session - 03/25/17 1501    Visit Number  3    Number of Visits  17    Date for SLP Re-Evaluation  04/29/17    SLP Start Time  1401    SLP Stop Time   1447    SLP Time Calculation (min)  46 min    Activity Tolerance  Patient tolerated treatment well       Past Medical History:  Diagnosis Date  . Liver lesion   . Migraines   . Sigmoid diverticulum     Past Surgical History:  Procedure Laterality Date  . ABDOMINAL HYSTERECTOMY    . abdominal plasty  03/2015  . APPENDECTOMY    . CESAREAN SECTION      There were no vitals filed for this visit.  Subjective Assessment - 03/25/17 1409    Subjective  "I got a house this weekend and opened a bank account"    Currently in Pain?  Yes    Pain Score  4     Pain Location  Head    Pain Descriptors / Indicators  Aching    Pain Type  Chronic pain    Pain Onset  More than a month ago            ADULT SLP TREATMENT - 03/25/17 1412      General Information   Behavior/Cognition  Alert;Cooperative      Treatment Provided   Treatment provided  Cognitive-Linquistic      Cognitive-Linquistic Treatment   Treatment focused on  Cognition    Skilled Treatment  Pt reports utilizing lists at work with success, and using a larger check register to assist with organization. Pt is waiting on approval for adderall.  Instructed pt to focus on doing e mails and texts during set times at work.  Pt is moving out this weekend. Facilitated strategies for bill and budget organization. Alternating attention and attention to details required extended time and repetition of instructions. Overall slow  processing.       Assessment / Recommendations / Plan   Plan  Continue with current plan of care      Progression Toward Goals   Progression toward goals  Progressing toward goals       SLP Education - 03/25/17 1455    Education provided  Yes    Education Details  organizing finances, compensations for attention impairment at home and work    Person(s) Educated  Patient    Methods  Explanation    Comprehension  Verbalized understanding;Need further instruction       SLP Short Term Goals - 03/25/17 1500      SLP SHORT TERM GOAL #1   Title  Pt will alternate attention between 2 simple cognitive linguistic tasks with 85% on each and occasional min A    Time  3    Period  Weeks    Status  On-going      SLP SHORT TERM GOAL #2   Title  Pt will implement 2 environmental/external compensations for reduced attention at work with success over 2 sessions    Time  3    Period  Weeks    Status  On-going  SLP SHORT TERM GOAL #3   Title  Pt will implement 2 external aids for memory (timer, sticky notes, etc) with success at home or work over 2 sessions    Time  3    Status  On-going      SLP SHORT TERM GOAL #4   Title  Pt will verbalize 4 compensation for attention with rare min A    Time  3    Status  On-going       SLP Long Term Goals - 03/25/17 1501      SLP LONG TERM GOAL #1   Title  Pt will divide attention between 2 simple cognitive lingusitic tasks with 80% on each and occasional min A    Time  7    Status  On-going      SLP LONG TERM GOAL #2   Title  Pt will verbalize 5 compensations for attention with rare min A    Time  7    Period  Weeks    Status  On-going      SLP LONG TERM GOAL #3   Title  Pt will impletment 3 compensatory strategies successfully at home/work over 2 sessions - per her report    Time  7    Period  Weeks    Status  On-going      SLP LONG TERM GOAL #4   Title  Pt will complete complex naming tasks with 85% accuracy and rare min A     Time  7    Period  Weeks    Status  On-going       Plan - 03/25/17 1457    Clinical Impression Statement  Pt has implemented compensation of daily to do list at work with reported success. She is planning on using color coded calendar at home (pt moves this upcoming weekend). 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 Pt required extended time and occasional min A for attention to details and altnerating attention of mildly complex reasoning tasks. Continue skilled ST to maximize cognition for success at work and setting up new home.     Speech Therapy Frequency  2x / week    Treatment/Interventions  SLP instruction and feedback;Cognitive reorganization;Internal/external aids;Compensatory strategies;Patient/family education;Functional tasks;Cueing hierarchy;Language facilitation;Environmental controls    Potential Considerations  Severity of impairments;Financial resources;Family/community support       Patient will benefit from skilled therapeutic intervention in order to improve the following deficits and impairments:   Cognitive communication deficit    Problem List Patient Active Problem List   Diagnosis Date Noted  . PTSD (post-traumatic stress disorder) 02/17/2017  . 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 Essential tremor 07/04/2016  . Postconcussion syndrome 06/10/2016  . Head injury 06/10/2016  . Other complicated headache syndrome 06/10/2016  . Neck pain 06/10/2016  . Decreased body weight 12/07/2014  . 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 Chronic migraine     Lovvorn, Radene JourneyLaura Ann MS, CCC-SLP 03/25/2017, 3:02 PM  [facility] [address] [place], [place], [address] Phone: [phone]   Fax:  [phone]   Name: Robin Stout MRN: [id number] Date of Birth: 07/10/1975

## 2017-03-25 NOTE — Patient Instructions (Signed)
  Budget - make a list with due dates and your paydays  Programme researcher, broadcasting/film/videoCar  [employer]  Water  Lunch money  Phone  Rent  Car Insurance  [employer]  Make a list of all bills - when you figure out when they are due, keep this on a chart  Love that you are using big visuals and color coding  Continue trying to check e mails and texts at scheduled times, maybe twice a work day   Continue trying to schedule 30 minutes a few days a week to work on [employer] In[employer] box  Great job keeping a to do list at work!!   Set up a written to do list for home, including phone calls you need to make, information you need to gather (such as cheaper car insurance). Include non-urgent/non-essential tasks to be completed after you are more settled   Set up a folder for mail - sort out junk mail daily and keep bills etc in the folder

## 2017-03-25 NOTE — Telephone Encounter (Signed)
Recieved a phone call from patients insurance company stating that the information that was supplied was expired, called patient and retreive correct insurance card information then resubmitted

## 2017-03-26 ENCOUNTER — Encounter: Payer: Self-pay | Admitting: Physical Therapy
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
## 2017-03-27 ENCOUNTER — Ambulatory Visit: Payer: PRIVATE HEALTH INSURANCE | Admitting: Physical Therapy

## 2017-03-27 ENCOUNTER — Other Ambulatory Visit: Payer: Self-pay

## 2017-03-27 ENCOUNTER — Ambulatory Visit: Payer: PRIVATE HEALTH INSURANCE | Admitting: Speech Pathology

## 2017-03-27 ENCOUNTER — Encounter: Payer: Self-pay | Admitting: Speech Pathology

## 2017-03-27 DIAGNOSIS — R42 Dizziness and giddiness: Secondary | ICD-10-CM

## 2017-03-27 DIAGNOSIS — R41841 Cognitive communication deficit: Secondary | ICD-10-CM

## 2017-03-27 DIAGNOSIS — R2681 Unsteadiness on feet: Secondary | ICD-10-CM

## 2017-03-27 NOTE — Telephone Encounter (Signed)
Recieved returns on both PA's, both denyed, called patients insurance company and was informed that name brand trokendi was denied but they would approve of the generic brand name topiramate, called pharmacy and informed them of the change, adderal remains a battle, resubmitted appeal and prior authorization for the generic and name brand for that medication on 03-26-2017

## 2017-03-27 NOTE — Telephone Encounter (Addendum)
Latest attempt to receive prior authorization for adderall for this patient has returned denied due to:  not meeting the prior authorization requirement(s).  Amphetamine/dextroamphetamine salts 5mg  extended-release capsule is denied because the use is not supported by the Food and Drug Administration (FDA) or by one of the references below for treating your medical condition(s): Postconcussional syndrome and Post-traumatic stress disorder, unspecified. Coverage requires that the requested drug has been recognized for the treatment of your medical condition(s) by one of the following: (1) Two articles from major peer-reviewed medical journals that present data supporting the proposed off label use or uses as generally safe and effective (unless there is clear and convincing contradictory evidence presented in a major peer-reviewed medical journal); OR (2) the [facility] Formulary Service (AHFS) Drug Information; OR (3) DRUGDEX System by [employer].  Attempted to contact patient with possible alternatives for coverage of this medication, IE GoodRx or having the medication changed to amphetamine-dextroamphetamine IR instead of the XR, patient stated would be unable to afford either the XR or IR versions of the medication using GoodRX or any other means.   Would there be a possible cheaper version of this type of medication which can produce the same effects? Please advise.

## 2017-03-27 NOTE — Patient Instructions (Signed)
  If you are going to a new restaurant, look up the menu online before you go  Continue your good organizing strategies of lists, keeping schedules of work to be done, and setting up boundries at work  Consider ordering online your items at grocery or [employer] for pick up if they offer that service

## 2017-03-27 NOTE — Therapy (Signed)
[facility] [address] [place], [place], [address] Phone: [phone]   Fax:  [phone]  Speech Language Pathology Treatment  Patient Details  Name: Robin Stout MRN: [id number] Date of Birth: 06/16/1975 Referring Provider: Dr. Faith RogueZachary Swartz   Encounter Date: 03/27/2017  End of Session - 03/27/17 1959    Visit Number  4    Number of Visits  17    SLP Start Time  1317    SLP Stop Time   1401    SLP Time Calculation (min)  44 min    Activity Tolerance  Patient tolerated treatment well       Past Medical History:  Diagnosis Date  . Liver lesion   . Migraines   . Sigmoid diverticulum     Past Surgical History:  Procedure Laterality Date  . ABDOMINAL HYSTERECTOMY    . abdominal plasty  03/2015  . APPENDECTOMY    . CESAREAN SECTION      There were no vitals filed for this visit.  Subjective Assessment - 03/27/17 1324    Subjective  "I missed 2 exits trying to get here, and I know where it is, I used to work near hear"    Currently in Pain?  Yes    Pain Score  3     Pain Location  Head    Pain Descriptors / Indicators  Aching    Pain Type  Chronic pain    Pain Onset  More than a month ago            ADULT SLP TREATMENT - 03/27/17 1324      General Information   Behavior/Cognition  Alert;Cooperative      Treatment Provided   Treatment provided  Cognitive-Linquistic      Cognitive-Linquistic Treatment   Treatment focused on  Cognition    Skilled Treatment  Pt reports her alarm has not gone off for 2 days she has been late to work. Pt bought large calendar for wall at home to assist with organization, memory and attention. She also got a hook for her keys,as she has been misplacing them. Pt reports talking to her boss about having a more set schedule with less interuptions at work due to attention impairments.  Facilcitated altenrating attention between written and reading tasks in distracting  environment with extended time - and mild distraction by noisy environment, which she mentioned several times. Alternating attention on deductive reasoning puzzle resulted in pt requiring min A to       Assessment / Recommendations / Plan   Plan  Continue with current plan of care      Progression Toward Goals   Progression toward goals  Progressing toward goals       SLP Education - 03/27/17 1956    Education provided  Yes    Education Details  compensations for attention and reduced processing     Person(s) Educated  Patient    Methods  Explanation;Demonstration;Handout    Comprehension  Verbalized understanding;Returned demonstration;Need further instruction       SLP Short Term Goals - 03/27/17 1958      SLP SHORT TERM GOAL #1   Title  Pt will alternate attention between 2 simple cognitive linguistic tasks with 85% on each and occasional min A    Time  3    Period  Weeks    Status  On-going      SLP SHORT TERM GOAL #2   Title  Pt  will implement 2 environmental/external compensations for reduced attention at work with success over 2 sessions    Baseline  03/27/17;     Time  3    Period  Weeks    Status  On-going      SLP SHORT TERM GOAL #3   Title  Pt will implement 2 external aids for memory (timer, sticky notes, etc) with success at home or work over 2 sessions    Baseline  03/27/17    Time  3    Status  On-going      SLP SHORT TERM GOAL #4   Title  Pt will verbalize 4 compensation for attention with rare min A    Baseline  03/27/17    Time  3    Status  On-going       SLP Long Term Goals - 03/27/17 1959      SLP LONG TERM GOAL #1   Title  Pt will divide attention between 2 simple cognitive lingusitic tasks with 80% on each and occasional min A    Time  7    Status  On-going      SLP LONG TERM GOAL #2   Title  Pt will verbalize 5 compensations for attention with rare min A    Time  7    Period  Weeks    Status  On-going      SLP LONG TERM GOAL #3    Title  Pt will impletment 3 compensatory strategies successfully at home/work over 2 sessions - per her report    Time  7    Period  Weeks    Status  On-going      SLP LONG TERM GOAL #4   Title  Pt will complete complex naming tasks with 85% accuracy and rare min A    Time  7    Period  Weeks    Status  On-going       Plan - 03/27/17 1958    Clinical Impression Statement  Pt has implemented compensation of daily to do list at work with reported success. She is planning on using color coded calendar at home (pt moves this upcoming weekend). 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 Pt required extended time and occasional min A for attention to details and altnerating attention of mildly complex reasoning tasks. Continue skilled ST to maximize cognition for success at work and setting up new home.        Patient will benefit from skilled therapeutic intervention in order to improve the following deficits and impairments:   Cognitive communication deficit    Problem List Patient Active Problem List   Diagnosis Date Noted  . PTSD (post-traumatic stress disorder) 02/17/2017  . 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 Essential tremor 07/04/2016  . Postconcussion syndrome 06/10/2016  . Head injury 06/10/2016  . Other complicated headache syndrome 06/10/2016  . Neck pain 06/10/2016  . Decreased body weight 12/07/2014  . 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 Chronic migraine     Lovvorn, Radene JourneyLaura Ann MS, CCC-SLP 03/27/2017, 8:00 PM  [facility] [address] [place], [place], [address] Phone: [phone]   Fax:  [phone]   Name: Robin Stout MRN: [id number] Date of Birth: December 20, 1975

## 2017-03-28 ENCOUNTER — Encounter: Payer: Self-pay | Admitting: Physical Therapy
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
## 2017-03-28 NOTE — Telephone Encounter (Signed)
Called patient and informed her of information, she does agree to the methylphenidate and has asked me to try a prior auth with her insurance for it, either way depending on outcome she still would like to receive the script for this medication and should be able to pick it up next week with instructions [facility] how [facility].

## 2017-03-28 NOTE — Telephone Encounter (Signed)
We certainly could change to an immediate release form.  I was hoping with her insurance that she would have coverage for a long-acting form so that she did not have to remember to take a second dose while at work or in the middle of the day.  We could you simply methylphenidate 5 mg twice daily( 1 at breakfast one at lunchtime) #60

## 2017-04-01 ENCOUNTER — Encounter: Payer: Self-pay | Admitting: Speech Pathology

## 2017-04-01 ENCOUNTER — Encounter: Payer: Self-pay | Admitting: Physical Therapy

## 2017-04-02 ENCOUNTER — Telehealth: Payer: Self-pay | Admitting: *Deleted

## 2017-04-02 NOTE — Telephone Encounter (Signed)
These medication issues are ridiculous with her.  Sorry for all the trouble.  Let us just use regular Topamax 50 mg at bedtime #33 refills thanks

## 2017-04-02 NOTE — Telephone Encounter (Signed)
At the advice of Cover My Meds I have called [employer] for the denial of the Trokendi XR 50 mg bid.  In speaking with the representative it was denied because of Plan Exclusion.  It will need a letter of medical necessity from the MD in order to proceed.

## 2017-04-03 ENCOUNTER — Ambulatory Visit: Payer: PRIVATE HEALTH INSURANCE | Admitting: Physical Therapy

## 2017-04-03 ENCOUNTER — Encounter: Payer: Self-pay | Admitting: Physical Therapy

## 2017-04-03 ENCOUNTER — Ambulatory Visit: Payer: PRIVATE HEALTH INSURANCE | Attending: Physical Medicine & Rehabilitation | Admitting: Speech Pathology

## 2017-04-03 ENCOUNTER — Other Ambulatory Visit: Payer: Self-pay

## 2017-04-03 ENCOUNTER — Encounter: Payer: Self-pay | Admitting: Speech Pathology

## 2017-04-03 DIAGNOSIS — R29818 Other symptoms and signs involving the nervous system: Secondary | ICD-10-CM | POA: Diagnosis present

## 2017-04-03 DIAGNOSIS — R2681 Unsteadiness on feet: Secondary | ICD-10-CM

## 2017-04-03 DIAGNOSIS — R42 Dizziness and giddiness: Secondary | ICD-10-CM | POA: Diagnosis present

## 2017-04-03 DIAGNOSIS — R41841 Cognitive communication deficit: Secondary | ICD-10-CM | POA: Diagnosis present

## 2017-04-03 DIAGNOSIS — R262 Difficulty in walking, not elsewhere classified: Secondary | ICD-10-CM | POA: Diagnosis present

## 2017-04-03 MED ORDER — METHYLPHENIDATE HCL 5 MG PO TABS: 5.0000 mg | ORAL_TABLET | Freq: Two times a day (BID) | ORAL | 0 refills | Status: DC
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
## 2017-04-03 NOTE — Telephone Encounter (Addendum)
This issue was already addressed in reguards to patients trokendi, on my previous messages in patients chart dating 03-24-2017:  Recieved returns on both PA's, both denied, called patients insurance company and was informed that name brand trokendi was denied but they would approve of the generic brand name topiramate, called pharmacy and informed them of the change, adderal remains a battle, resubmitted appeal and prior authorization for the generic and name brand for that medication on 03-26-2017  Patient has already picked up medication from pharmacy of the generic version of trokendi which is the topiramate ER.  This issue was completed.

## 2017-04-08 ENCOUNTER — Encounter: Payer: Self-pay | Admitting: Physical Therapy

## 2017-04-08 ENCOUNTER — Encounter: Payer: Self-pay | Admitting: Speech Pathology

## 2017-04-10 ENCOUNTER — Encounter: Payer: Self-pay | Admitting: Speech Pathology

## 2017-04-10 ENCOUNTER — Ambulatory Visit: Payer: PRIVATE HEALTH INSURANCE | Admitting: Physical Therapy

## 2017-04-10 ENCOUNTER — Other Ambulatory Visit: Payer: Self-pay

## 2017-04-10 ENCOUNTER — Ambulatory Visit: Payer: PRIVATE HEALTH INSURANCE | Admitting: Speech Pathology

## 2017-04-10 ENCOUNTER — Encounter: Payer: Self-pay | Admitting: Physical Therapy

## 2017-04-10 DIAGNOSIS — R42 Dizziness and giddiness: Secondary | ICD-10-CM

## 2017-04-10 DIAGNOSIS — R2681 Unsteadiness on feet: Secondary | ICD-10-CM

## 2017-04-10 DIAGNOSIS — R41841 Cognitive communication deficit: Secondary | ICD-10-CM
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
## 2017-04-10 NOTE — Therapy (Signed)
[facility] [address] [place], [place], [address] Phone: [phone]   Fax:  [phone]  Speech Language Pathology Treatment  Patient Details  Name: Robin Stout MRN: [id number] Date of Birth: 1975-10-11 Referring Provider: Dr. Faith RogueZachary Swartz   Encounter Date: 04/10/2017  End of Session - 04/10/17 1522    Visit Number  6    Number of Visits  17    Date for SLP Re-Evaluation  04/29/17    SLP Start Time  1018    SLP Stop Time   1100    SLP Time Calculation (min)  42 min    Activity Tolerance  Patient tolerated treatment well       Past Medical History:  Diagnosis Date  . Liver lesion   . Migraines   . Sigmoid diverticulum     Past Surgical History:  Procedure Laterality Date  . ABDOMINAL HYSTERECTOMY    . abdominal plasty  03/2015  . APPENDECTOMY    . CESAREAN SECTION      There were no vitals filed for this visit.  Subjective Assessment - 04/10/17 1027    Subjective  "I feel like I'm doing good organizing my home and finances"    Currently in Pain?  Yes    Pain Score  6     Pain Location  Head    Pain Orientation  -- headache    Pain Descriptors / Indicators  Aching    Pain Onset  More than a month ago    Pain Frequency  Constant    Aggravating Factors   lights, sounds, mental work    Pain Relieving Factors  rest    Effect of Pain on Daily Activities  decreases attention/concentration    Multiple Pain Sites  No            ADULT SLP TREATMENT - 04/10/17 1033      General Information   Behavior/Cognition  Alert;Cooperative      Treatment Provided   Treatment provided  Cognitive-Linquistic      Cognitive-Linquistic Treatment   Treatment focused on  Cognition    Skilled Treatment  Pt continues to organize house to compensate for her attention and short term memory impairments. Complex problem solving pt required min A for selective attention due to distracting environment. Pt  requires some extended time for processing complex problem solving.  Alternating attention between complex deduction puzzle and conversation with rare min A. Pt reports carrying over compensations for attention and memory at work and home      Assessment / Recommendations / Plan   Plan  Continue with current plan of care      Progression Toward Goals   Progression toward goals  Progressing toward goals         SLP Short Term Goals - 04/10/17 1521      SLP SHORT TERM GOAL #1   Title  Pt will alternate attention between 2 simple cognitive linguistic tasks with 85% on each and occasional min A    Time  3    Period  Weeks    Status  Achieved      SLP SHORT TERM GOAL #2   Title  Pt will implement 2 environmental/external compensations for reduced attention at work with success over 2 sessions    Baseline  03/27/17; 04/03/17    Time  3    Period  Weeks    Status  Achieved      SLP SHORT TERM  GOAL #3   Title  Pt will implement 2 external aids for memory (timer, sticky notes, etc) with success at home or work over 2 sessions    Baseline  03/27/17; 04/03/17    Time  3    Status  Achieved      SLP SHORT TERM GOAL #4   Title  Pt will verbalize 4 compensation for attention with rare min A    Baseline  03/27/17    Time  2    Status  On-going       SLP Long Term Goals - 04/10/17 1522      SLP LONG TERM GOAL #1   Title  Pt will divide attention between 2 simple cognitive lingusitic tasks with 80% on each and occasional min A    Time  5    Status  On-going      SLP LONG TERM GOAL #2   Title  Pt will verbalize 5 compensations for attention with rare min A    Time  5    Period  Weeks    Status  On-going      SLP LONG TERM GOAL #3   Title  Pt will impletment 3 compensatory strategies successfully at home/work over 2 sessions - per her report    Time  5    Period  Weeks    Status  On-going      SLP LONG TERM GOAL #4   Title  Pt will complete complex naming tasks with 85%  accuracy and rare min A    Time  5    Period  Weeks    Status  On-going       Plan - 04/10/17 1517    Clinical Impression Statement  Pt continues to demonstrate difficulty attending in noisy environment. Today she verbalized distractions to baby cry, drawers opening and closing and my clock, which reduced her problem solving ability and slowed processing. Hope continues to be motivated and implements compensations for her attention and memory impairments. 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 Continue skilled ST to maximize cognition for success with high level ADL's and at work.    Speech Therapy Frequency  2x / week    Treatment/Interventions  SLP instruction and feedback;Cognitive reorganization;Internal/external aids;Compensatory strategies;Patient/family education;Functional tasks;Cueing hierarchy;Language facilitation;Environmental controls    Potential to Achieve Goals  Good    Potential Considerations  Financial resources;Family/community support    Consulted and Agree with Plan of Care  Patient       Patient will benefit from skilled therapeutic intervention in order to improve the following deficits and impairments:   Cognitive communication deficit    Problem List Patient Active Problem List   Diagnosis Date Noted  . PTSD (post-traumatic stress disorder) 02/17/2017  . 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 Essential tremor 07/04/2016  . Postconcussion syndrome 06/10/2016  . Head injury 06/10/2016  . Other complicated headache syndrome 06/10/2016  . Neck pain 06/10/2016  . Decreased body weight 12/07/2014  . 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 Chronic migraine     Lovvorn, Radene JourneyLaura Ann MS, CCC-SLP 04/10/2017, 3:23 PM  [facility] [address] [place], [place], [address] Phone: [phone]   Fax:  [phone]   Name: Robin IbaHope W  Stout MRN: [id number] Date of Birth: 06-Jul-1975

## 2017-04-10 NOTE — Patient Instructions (Addendum)
Compensatory Strategies: Corrective Saccades    1. Holding two stationary targets placed _12___ inches apart, move eyes to target, keep head still. 2. Then move head in direction of target while eyes remain on target. 3/4. Repeat in opposite direction. Perform sitting. Repeat sequence ___10_ times per session. Do __1-2__ sessions per day.  DO DIAGONALs and VERTICAL - (HIGHER PRIORITY THAN HORIZONTAL)    2)  Pt was given convergence exercise - with 2 pens - 1 at arm's length and 2nd pen 10 cm in front of nose  3) Convergence exercise - bringing thumb inward toward nose - stop if it doubles

## 2017-04-14 ENCOUNTER — Encounter: Payer: PRIVATE HEALTH INSURANCE | Attending: Psychology | Admitting: Psychology

## 2017-04-14 DIAGNOSIS — F0781 Postconcussional syndrome: Secondary | ICD-10-CM | POA: Diagnosis not present

## 2017-04-14 DIAGNOSIS — F4310 Post-traumatic stress disorder, unspecified: Secondary | ICD-10-CM | POA: Diagnosis not present

## 2017-04-14 DIAGNOSIS — F329 Major depressive disorder, single episode, unspecified: Secondary | ICD-10-CM | POA: Diagnosis not present

## 2017-04-14 DIAGNOSIS — G43709 Chronic migraine without aura, not intractable, without status migrainosus: Secondary | ICD-10-CM

## 2017-04-14 DIAGNOSIS — G43809 Other migraine, not intractable, without status migrainosus: Secondary | ICD-10-CM | POA: Insufficient documentation

## 2017-04-14 DIAGNOSIS — IMO0002 Reserved for concepts with insufficient information to code with codable children: Secondary | ICD-10-CM

## 2017-04-15 ENCOUNTER — Ambulatory Visit: Payer: PRIVATE HEALTH INSURANCE | Admitting: Speech Pathology

## 2017-04-15 ENCOUNTER — Encounter: Payer: Self-pay | Admitting: Physical Therapy

## 2017-04-15 ENCOUNTER — Other Ambulatory Visit: Payer: Self-pay

## 2017-04-15 ENCOUNTER — Ambulatory Visit: Payer: PRIVATE HEALTH INSURANCE | Admitting: Physical Therapy

## 2017-04-15 ENCOUNTER — Encounter: Payer: Self-pay | Admitting: Speech Pathology

## 2017-04-15 DIAGNOSIS — R41841 Cognitive communication deficit: Secondary | ICD-10-CM

## 2017-04-15 DIAGNOSIS — R2681 Unsteadiness on feet: Secondary | ICD-10-CM

## 2017-04-15 DIAGNOSIS — R42 Dizziness and giddiness: Secondary | ICD-10-CM

## 2017-04-15 NOTE — Patient Instructions (Addendum)
  Ask Dr. Kieth Brightlyodenbough about cognitive testing per your lawyer's request  Neuropsychological testing   Ask if he is documenting your symptoms/brain changes  Use timers with oven and stove, even if you have to use you phone timer  Do not use your stove as a shelf - nothing on the stove except pots/pans  Put sign up at door and bedroom - to check oven/stove before leaving/bed  Get an egg timer if needed
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
## 2017-04-15 NOTE — Therapy (Signed)
[facility] [address] [place], [place], [address] Phone: [phone]   Fax:  [phone]  Speech Language Pathology Treatment  Patient Details  Name: Robin Stout MRN: [id number] Date of Birth: [birth year] Referring Provider: Dr. Faith RogueZachary Swartz   Encounter Date: 04/15/2017  End of Session - 04/15/17 1609    Visit Number  7    Number of Visits  17    SLP Start Time  1318    SLP Stop Time   1402    SLP Time Calculation (min)  44 min       Past Medical History:  Diagnosis Date  . Liver lesion   . Migraines   . Sigmoid diverticulum     Past Surgical History:  Procedure Laterality Date  . ABDOMINAL HYSTERECTOMY    . abdominal plasty  03/2015  . APPENDECTOMY    . CESAREAN SECTION      There were no vitals filed for this visit.  Subjective Assessment - 04/15/17 1322    Subjective  "I went to the work breakfast party and it was so loud"    Currently in Pain?  Yes    Pain Score  3     Pain Location  Head    Pain Descriptors / Indicators  Aching    Pain Type  Chronic pain    Pain Onset  More than a month ago    Pain Frequency  Constant            ADULT SLP TREATMENT - 04/15/17 1339      General Information   Behavior/Cognition  Alert;Cooperative      Treatment Provided   Treatment provided  Cognitive-Linquistic      Cognitive-Linquistic Treatment   Treatment focused on  Cognition    Skilled Treatment  Pt tearful today as her son will be with his father on Christmas. She reports turning on the wrong burner, leaving the kitchen and melting tupperware container, due to a distraction, as well as leaving her oven on for 8 hours. Instructed pt to use signs at door and bed to check oven/stove before leaving/sleeping and use timers with all cooking. Divided attention between mildly complex problem solving and conversation  with occasional min A, 95% on cognitive task. Divided attention betwen simple  naming task while attending to card flip for matches with occasional min A, 85% on card flip, 90% on category matrix      Assessment / Recommendations / Plan   Plan  Continue with current plan of care      Progression Toward Goals   Progression toward goals  Progressing toward goals       SLP Education - 04/15/17 1605    Education provided  Yes    Education Details  continue compensations for reduced attention in the kitchen    Person(s) Educated  Patient    Methods  Explanation;Handout    Comprehension  Verbalized understanding       SLP Short Term Goals - 04/15/17 1608      SLP SHORT TERM GOAL #1   Title  Pt will alternate attention between 2 simple cognitive linguistic tasks with 85% on each and occasional min A    Time  3    Period  Weeks    Status  Achieved      SLP SHORT TERM GOAL #2   Title  Pt will implement 2 environmental/external compensations for reduced attention at work with success over 2 sessions  Baseline  03/27/17; 04/03/17    Time  3    Period  Weeks    Status  Achieved      SLP SHORT TERM GOAL #3   Title  Pt will implement 2 external aids for memory (timer, sticky notes, etc) with success at home or work over 2 sessions    Baseline  03/27/17; 04/03/17    Time  3    Status  Achieved      SLP SHORT TERM GOAL #4   Title  Pt will verbalize 4 compensation for attention with rare min A    Baseline  03/27/17    Time  1    Status  On-going       SLP Long Term Goals - 04/15/17 1608      SLP LONG TERM GOAL #1   Title  Pt will divide attention between 2 simple cognitive lingusitic tasks with 80% on each and occasional min A    Time  4    Status  On-going      SLP LONG TERM GOAL #2   Title  Pt will verbalize 5 compensations for attention with rare min A    Time  4    Period  Weeks    Status  On-going      SLP LONG TERM GOAL #3   Title  Pt will impletment 3 compensatory strategies successfully at home/work over 2 sessions - per her report    Time   4    Period  Weeks    Status  On-going      SLP LONG TERM GOAL #4   Title  Pt will complete complex naming tasks with 85% accuracy and rare min A    Time  4    Period  Weeks    Status  On-going       Plan - 04/15/17 1607    Clinical Impression Statement  Pt continues to demonstrate difficulty attending in noisy environment. Today she verbalized distractions to baby cry, drawers opening and closing and my clock, which reduced her problem solving ability and slowed processing. Robin continues to be motivated and implements compensations for her attention and memory impairments. 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 Continue skilled ST to maximize cognition for success with high level ADL's and at work.    Treatment/Interventions  SLP instruction and feedback;Cognitive reorganization;Internal/external aids;Compensatory strategies;Patient/family education;Functional tasks;Cueing hierarchy;Language facilitation;Environmental controls    Potential to Achieve Goals  Good    Potential Considerations  Financial resources;Family/community support       Patient will benefit from skilled therapeutic intervention in order to improve the following deficits and impairments:   Cognitive communication deficit    Problem List Patient Active Problem List   Diagnosis Date Noted  . PTSD (post-traumatic stress disorder) 02/17/2017  . 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 Essential tremor 07/04/2016  . Postconcussion syndrome 06/10/2016  . Head injury 06/10/2016  . Other complicated headache syndrome 06/10/2016  . Neck pain 06/10/2016  . Decreased body weight 12/07/2014  . 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 Chronic migraine     Denijah Karrer, Radene JourneyLaura Ann  MS, CCC.SLP 04/15/2017, 4:10 PM  [facility] [address] [place], [place], [address] Phone: [phone]    Fax:  [phone]   Name: Robin Stout MRN: [id number] Date of Birth: January 30, 1976

## 2017-04-16 ENCOUNTER — Ambulatory Visit: Payer: PRIVATE HEALTH INSURANCE | Admitting: Speech Pathology

## 2017-04-16 ENCOUNTER — Other Ambulatory Visit: Payer: Self-pay

## 2017-04-16 ENCOUNTER — Encounter: Payer: Self-pay | Admitting: Rehabilitative and Restorative Service Providers"

## 2017-04-16 ENCOUNTER — Ambulatory Visit: Payer: PRIVATE HEALTH INSURANCE | Admitting: Rehabilitative and Restorative Service Providers"

## 2017-04-16 DIAGNOSIS — R41841 Cognitive communication deficit: Secondary | ICD-10-CM

## 2017-04-16 DIAGNOSIS — R29818 Other symptoms and signs involving the nervous system: Secondary | ICD-10-CM

## 2017-04-16 DIAGNOSIS — R2681 Unsteadiness on feet: Secondary | ICD-10-CM

## 2017-04-16 DIAGNOSIS — R262 Difficulty in walking, not elsewhere classified: Secondary | ICD-10-CM

## 2017-04-16 DIAGNOSIS — R42 Dizziness and giddiness: Secondary | ICD-10-CM

## 2017-04-16 NOTE — Therapy (Signed)
[facility] [address] [place], [place], [address] Phone: [phone]   Fax:  [phone]  Speech Language Pathology Treatment  Patient Details  Name: Robin Stout MRN: [id number] Date of Birth: 1976-01-30 Referring Provider: Dr. Faith RogueZachary Swartz   Encounter Date: 04/16/2017  End of Session - 04/16/17 1120    Visit Number  8    Number of Visits  17    Date for SLP Re-Evaluation  04/29/17    SLP Start Time  0933    SLP Stop Time   1015    SLP Time Calculation (min)  42 min    Activity Tolerance  Patient tolerated treatment well       Past Medical History:  Diagnosis Date  . Liver lesion   . Migraines   . Sigmoid diverticulum     Past Surgical History:  Procedure Laterality Date  . ABDOMINAL HYSTERECTOMY    . abdominal plasty  03/2015  . APPENDECTOMY    . CESAREAN SECTION      There were no vitals filed for this visit.  Subjective Assessment - 04/16/17 0942    Subjective  "I put a chair together last night and Im waking up to my alarm better"    Currently in Pain?  Yes    Pain Score  5     Pain Location  Head    Pain Descriptors / Indicators  Aching;Headache    Pain Type  Chronic pain    Pain Onset  More than a month ago    Pain Frequency  Constant    Effect of Pain on Daily Activities  decreases attention/concentration            ADULT SLP TREATMENT - 04/16/17 0946      General Information   Behavior/Cognition  Alert;Cooperative      Treatment Provided   Treatment provided  Cognitive-Linquistic      Cognitive-Linquistic Treatment   Treatment focused on  Cognition    Skilled Treatment  Pt had been forgetting her mid day meds at home and not having them at work. She bought a portable pill organizer and is organizing meds on Sunday evening - she has had success taking meds on time with this strategy. Divided attention between moderately complex deduction task and simple verbal  alphabetizing task with 6/8 correct on alphbetizing task and minimal extended time for deduction. Divided attention between written problem solving task and verbal money counting task with 6/8 on money task and extended time on deduction task., with 100% accuracy       Assessment / Recommendations / Plan   Plan  Continue with current plan of care       SLP Education - 04/15/17 1605    Education provided  Yes    Education Details  continue compensations for reduced attention in the kitchen    Person(s) Educated  Patient    Methods  Explanation;Handout    Comprehension  Verbalized understanding       SLP Short Term Goals - 04/16/17 1118      SLP SHORT TERM GOAL #1   Title  Pt will alternate attention between 2 simple cognitive linguistic tasks with 85% on each and occasional min A    Time  3    Period  Weeks    Status  Achieved      SLP SHORT TERM GOAL #2   Title  Pt will implement 2 environmental/external compensations for reduced attention at work with  success over 2 sessions    Baseline  03/27/17; 04/03/17    Time  3    Period  Weeks    Status  Achieved      SLP SHORT TERM GOAL #3   Title  Pt will implement 2 external aids for memory (timer, sticky notes, etc) with success at home or work over 2 sessions    Baseline  03/27/17; 04/03/17    Time  3    Status  Achieved      SLP SHORT TERM GOAL #4   Title  Pt will verbalize 4 compensation for attention with rare min A    Baseline  03/27/17, 04/16/17    Time  1    Status  Achieved       SLP Long Term Goals - 04/16/17 1119      SLP LONG TERM GOAL #1   Title  Pt will divide attention between 2 simple cognitive lingusitic tasks with 80% on each and occasional min A    Time  4    Status  On-going      SLP LONG TERM GOAL #2   Title  Pt will verbalize 5 compensations for attention with rare min A    Time  4    Period  Weeks    Status  On-going      SLP LONG TERM GOAL #3   Title  Pt will impletment 3 compensatory  strategies successfully at home/work over 2 sessions - per her report    Time  4    Period  Weeks    Status  On-going      SLP LONG TERM GOAL #4   Title  Pt will complete complex naming tasks with 85% accuracy and rare min A    Time  4    Period  Weeks    Status  On-going       Plan - 04/16/17 1017    Clinical Impression Statement  Pt continues to carryover multiple compensatory strategies at home and work for memory and attention deficits. Robin continues to require extended time for alternating and dividing attention activities, as well as demonstrate reduced attention in higher level ADL tasks. Continue skilled ST to maximize cognition and carryover of compensations for cognitive impairments. Decrease to 1x per week due to good carryover of compensations.     Speech Therapy Frequency  2x / week    Treatment/Interventions  SLP instruction and feedback;Cognitive reorganization;Internal/external aids;Compensatory strategies;Patient/family education;Functional tasks;Cueing hierarchy;Language facilitation;Environmental controls    Potential Considerations  Financial resources;Family/community support       Patient will benefit from skilled therapeutic intervention in order to improve the following deficits and impairments:   Cognitive communication deficit    Problem List Patient Active Problem List   Diagnosis Date Noted  . PTSD (post-traumatic stress disorder) 02/17/2017  . 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 Essential tremor 07/04/2016  . Postconcussion syndrome 06/10/2016  . Head injury 06/10/2016  . Other complicated headache syndrome 06/10/2016  . Neck pain 06/10/2016  . Decreased body weight 12/07/2014  . 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 Chronic migraine     Laniece Hornbaker, Radene JourneyLaura Ann MS, CCC-SLP 04/16/2017, 11:21 AM  [facility] [address] [place], [place], [address] Phone: [phone]   Fax:   [phone]   Name: Robin Stout MRN: [id number] Date of Birth: 16-Mar-1976

## 2017-04-16 NOTE — Therapy (Signed)
[facility] [address] [place], [place], [address] Phone: [phone]   Fax:  [phone]  Physical Therapy Treatment  Patient Details  Name: Robin Stout MRN: [id number] Date of Birth: 09/13/1975 Referring Provider: Dr. Alger Simons   Encounter Date: 04/16/2017  PT End of Session - 04/16/17 2015    Visit Number  9    Number of Visits  12    Date for PT Re-Evaluation  05/03/17    Authorization Type  [employer]    Authorization Time Period  12 wks allowed per discipline    PT Start Time  0852    PT Stop Time  0935    PT Time Calculation (min)  43 min    Activity Tolerance  Patient tolerated treatment well    Behavior During Therapy  [facility] for tasks assessed/performed       Past Medical History:  Diagnosis Date  . Liver lesion   . Migraines   . Sigmoid diverticulum     Past Surgical History:  Procedure Laterality Date  . ABDOMINAL HYSTERECTOMY    . abdominal plasty  03/2015  . APPENDECTOMY    . CESAREAN SECTION      There were no vitals filed for this visit.  Subjective Assessment - 04/16/17 0854    Subjective  The patient reports headache today 4/10 rating.  Headaches occur every day.  Patient notes that bending down and quick head turns (especially up and down) are most challenging.  The patient notes the sensation is "I have to close my eyes and hold on to recenter myself" like things get "dark" and she notes nausea.      Patient Stated Goals  resolve the dizziness; improve hand/eye coordination and be able to do things (tie bow, fasten necklace without looking at it)    Currently in Pain?  Yes    Pain Score  4     Pain Location  Head    Pain Descriptors / Indicators  Headache    Pain Onset  More than a month ago    Pain Frequency  Intermittent    Aggravating Factors   lights, sounds, grocery shopping    Pain Relieving Factors  rest             Vestibular Assessment - 04/16/17 0907       Orthostatics   BP supine (x 5 minutes)  100/72    HR supine (x 5 minutes)  58    BP standing (after 1 minute)  90/70 patient leans posteriorly to balance,     HR standing (after 1 minute)  69    BP standing (after 3 minutes)  98/80    HR standing (after 3 minutes)  64    Orthostatics Comment  takes 30 seconds to 1 minute for patient subjectively to feel that things adjust.              [facility] Adult PT Treatment/Exercise - 04/16/17 2013      Ambulation/Gait   Ambulation/Gait  Yes    Gait Comments  Patient has some veering from midline during dynamic gait tasks (see vestibular treatment).  She was able to self recover from losses of balance.       Neuro Re-ed    Neuro Re-ed Details   --      Vestibular Treatment/Exercise - 04/16/17 0930      Vestibular Treatment/Exercise   Vestibular Treatment Provided  Habituation;Gaze    Habituation Exercises  Standing  Horizontal Head Turns;Standing Vertical Head Turns;Standing Diagonal Head Turns;180 degree Turns    Gaze Exercises  X1 Viewing Horizontal      Standing Horizontal Head Turns   Number of Reps   5    Symptom Description   x 3 reps:  2 times on thick foam using visual fixation to reduce symptoms, then 1 time on rocker board using visual fixation to improve tolerance.      Standing Vertical Head Turns   Number of Reps   5    Symptom Description   in standing with smaller ROM to improve tolerance while on rocker board      Standing Diagonal Head Turns   Symptiom Description   Performed head motion during gait initially performing in horizontal plane and then progressing to more diagonal movements as tolerated.  This increases symptoms of HA to 5/10.      180 degree Turns   Number of Reps   5    Symptom Description   using visual compensatory strategy of closing eyes during turns.       X1 Viewing Horizontal   Foot Position  feet apart on foam    Comments  performed x 45 seconds         Balance Exercises - 04/15/17  1459      Balance Exercises: Standing   Rockerboard  Anterior/posterior;Head turns;EO;EC;10 reps;Intermittent UE support;UE support        PT Education - 04/15/17 1500    Education provided  Yes    Education Details  instructed pt to continue with gaze stabilization and saccade exercise - and to discontinue doing pen push up exercise based on info given that research has shown that these exercises are not beneficial for convergence insufficiency     Person(s) Educated  Patient    Methods  Explanation    Comprehension  Verbalized understanding       PT Short Term Goals - 04/15/17 1517      PT SHORT TERM GOAL #1   Title  Perform SOT and establish goal as appropropriate.    Baseline  baseline 16/100 composite score on 03-10-17:  see LTG    Status  Achieved      PT SHORT TERM GOAL #2   Title  Improve DVA to </= 2 line difference to demo improved gaze stabilization.    Baseline  3 line difference; 2 1/2 line difference on 04-10-17 - pt able to read 1/2 line 2 lines above baseline    Time  4    Period  Weeks    Status  On-going      PT SHORT TERM GOAL #3   Title  Amb. 30' with horizontal head turns with min. LOB and c/o dizziness </= 3/10.    Baseline  dizziness 4/10 on 1st rep, 5/10 on 2nd rep - 04-10-17    Time  4    Period  Weeks    Status  Partially Met      PT SHORT TERM GOAL #4   Title  Improve DHI score by at least 16 points to demo improvement in status and symptoms.    Baseline  BASELINE ESTABLISHED 03/07/17:  58%;            64% on 04-10-17    Time  4    Period  Weeks    Status  On-going      PT SHORT TERM GOAL #5   Title  Independent in HEP for balance, visual and  vestibular exercises.    Status  Achieved        PT Long Term Goals - 04/15/17 1517      PT LONG TERM GOAL #1   Title  Improve SOT composite score to WNL's for improved balance.    Status  New      PT LONG TERM GOAL #2   Title  Improve DHI by at least 30 points to demo improvement in status and  symptoms.    Status  New      PT LONG TERM GOAL #3   Title  Pt will report ability to run/jog 1 mile with c/o dizziness </= 3/10.    Status  New      PT LONG TERM GOAL #4   Title  Amb. 30' with horizontal/vertical head turns with minimal sway with c/o dizziness </= 3/10.    Status  New      PT LONG TERM GOAL #5   Title  Pt will report ability to return to working out at least 3 days/week with moderate to min symptoms provoked.    Status  New            Plan - 04/16/17 2016    Clinical Impression Statement  The patient is tolerating head motion in therapy with slight increase in headache from 4/10 up to 5/10.  PT encouraged continued participation with HEP to improve symptoms.  We also focused on head position during gait adding reading card tasks into activities for awareness of head position.  PT also began instruction in compensatory strategies during turns to help with functional tasks.  Measured orthostatics due to c/o symptoms after bending and upon rising.  She has lower BP readings and some drop in systolic measure, but not to a clinically significant amount.    PT Treatment/Interventions  ADLs/Self Care Home Management;Balance training;Neuromuscular re-education;Patient/family education;Functional mobility training;Therapeutic activities;Therapeutic exercise;Vestibular;Manual techniques    PT Next Visit Plan  continue vestibular stimulation exercises on compliant surfaces incorporating gaze stabilization    Consulted and Agree with Plan of Care  Patient       Patient will benefit from skilled therapeutic intervention in order to improve the following deficits and impairments:  Difficulty walking, Dizziness, Decreased balance, Decreased cognition, Decreased coordination, Impaired vision/preception  Visit Diagnosis: Dizziness and giddiness  Unsteadiness on feet  Difficulty in walking, not elsewhere classified  Other symptoms and signs involving the nervous  system     Problem List Patient Active Problem List   Diagnosis Date Noted  . PTSD (post-traumatic stress disorder) 02/17/2017  . 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 Essential tremor 07/04/2016  . Postconcussion syndrome 06/10/2016  . Head injury 06/10/2016  . Other complicated headache syndrome 06/10/2016  . Neck pain 06/10/2016  . Decreased body weight 12/07/2014  . 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 Chronic migraine     Dejohn Ibarra, PT 04/16/2017, 8:19 PM  [facility] [address] [place], [place], [address] Phone: [phone]   Fax:  [phone]  Name: Robin Stout MRN: [id number] Date of Birth: August 25, 1975

## 2017-04-17 ENCOUNTER — Telehealth: Payer: Self-pay | Admitting: Psychology

## 2017-04-17 ENCOUNTER — Telehealth: Payer: Self-pay | Admitting: *Deleted

## 2017-04-17 ENCOUNTER — Encounter: Payer: Self-pay | Admitting: Psychology

## 2017-04-17 ENCOUNTER — Telehealth: Payer: Self-pay | Admitting: Physical Medicine & Rehabilitation

## 2017-04-17 MED ORDER — ESCITALOPRAM OXALATE 10 MG PO TABS: 10.0000 mg | ORAL_TABLET | Freq: Every day | ORAL | 3 refills | Status: DC

## 2017-04-17 NOTE — Telephone Encounter (Signed)
Sent her a prescription for Lexapro 10 mg at bedtime into her pharmacy.  We had discussed potential treatment of her depression in the past.  I am fine with that.  She wants something for breakthrough anxiety she will need to talk with her family doctor initially.
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
## 2017-04-17 NOTE — Telephone Encounter (Signed)
[facility] and [facility] had long discussion and immediate issues.  Stress about xmas and recent breakup with husband and impact upon son.  Working on issues about what to do.  Patient started feeling depressed and crying last night after feeling very positive earlier.  Talked about how to cope and lexapro.  She denies SI and is working on plan.  Prior she was to go to her old house on xmas and was anticipating that she would become overwhelmed.  Looked at alternatives.  She is to call back to talk with me if needed.

## 2017-04-17 NOTE — Telephone Encounter (Signed)
She actually showed up at the office and we have told her she needs to contact her PCP.

## 2017-04-17 NOTE — Telephone Encounter (Signed)
Phoned cell phone to advise RX was called into [facility] [address] - left voicemail - needs to contact primary care for anxiety. still

## 2017-04-17 NOTE — Telephone Encounter (Signed)
Robin Stout called and is asking if she can be prescribed something for depression and anxiety to help her get through the holidays.  Please advise.

## 2017-04-17 NOTE — Telephone Encounter (Signed)
Robin StanleyLisa is letting her know.

## 2017-04-21 ENCOUNTER — Encounter: Payer: Self-pay | Admitting: Rehabilitative and Restorative Service Providers"

## 2017-04-21 ENCOUNTER — Ambulatory Visit: Payer: PRIVATE HEALTH INSURANCE

## 2017-04-21 ENCOUNTER — Ambulatory Visit: Payer: PRIVATE HEALTH INSURANCE | Admitting: Rehabilitative and Restorative Service Providers"

## 2017-04-21 ENCOUNTER — Other Ambulatory Visit: Payer: Self-pay

## 2017-04-21 DIAGNOSIS — R42 Dizziness and giddiness: Secondary | ICD-10-CM

## 2017-04-21 DIAGNOSIS — R41841 Cognitive communication deficit: Secondary | ICD-10-CM | POA: Diagnosis not present

## 2017-04-21 DIAGNOSIS — R2681 Unsteadiness on feet: Secondary | ICD-10-CM

## 2017-04-21 DIAGNOSIS — R29818 Other symptoms and signs involving the nervous system: Secondary | ICD-10-CM

## 2017-04-21 DIAGNOSIS — R262 Difficulty in walking, not elsewhere classified: Secondary | ICD-10-CM
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
## 2017-04-21 NOTE — Patient Instructions (Signed)
  Please complete the assigned speech therapy homework prior to your next session and return it to the speech therapist at your next visit.  

## 2017-04-21 NOTE — Therapy (Signed)
[facility] [address] [place], [place], [address] Phone: [phone]   Fax:  [phone]  Speech Language Pathology Treatment  Patient Details  Name: Robin Stout MRN: [id number] Date of Birth: 1975-11-21 Referring Provider: Dr. Faith RogueZachary Swartz   Encounter Date: 04/21/2017  End of Session - 04/21/17 1233    Visit Number  9    Number of Visits  17    Date for SLP Re-Evaluation  04/29/17    SLP Start Time  0932    SLP Stop Time   1015    SLP Time Calculation (min)  43 min    Activity Tolerance  Patient tolerated treatment well       Past Medical History:  Diagnosis Date  . Liver lesion   . Migraines   . Sigmoid diverticulum     Past Surgical History:  Procedure Laterality Date  . ABDOMINAL HYSTERECTOMY    . abdominal plasty  03/2015  . APPENDECTOMY    . CESAREAN SECTION      There were no vitals filed for this visit.  Subjective Assessment - 04/21/17 1000    Subjective  "[facility] because [facility]'t focus."    Currently in Pain?  Yes    Pain Score  9     Pain Location  -- thumb    Pain Orientation  Right    Pain Descriptors / Indicators  Sharp    Pain Type  Acute pain    Pain Onset  1 to 4 weeks ago    Pain Frequency  Constant    Pain Relieving Factors  brace    Effect of Pain on Daily Activities  decreases concentration            ADULT SLP TREATMENT - 04/21/17 1005      General Information   Behavior/Cognition  Alert;Cooperative      Treatment Provided   Treatment provided  Cognitive-Linquistic      Cognitive-Linquistic Treatment   Treatment focused on  Cognition    Skilled Treatment  Pt explained her course of therapy thus far with slight verbosity. SLP suggested "office hours" for work in order to A staying focused, or sticky notes re: her target activity when interrupted so she can more easily alternate attention at work.  Pt tearful about how meds not having  much of a desired affect on her condition and how drowsy they can make her feel. In divided attention task (figuring simple change amount when given coin combinations <75 cents) and written (ordering 5 step sequences), pt alternated atteniton approx 80% of the time, with 100% accuracy in both tasks.       Assessment / Recommendations / Plan   Plan  Continue with current plan of care      Progression Toward Goals   Progression toward goals  Progressing toward goals       SLP Education - 04/21/17 1233    Education provided  Yes    Education Details  compensations for attention at work    [employer](s) Educated  Patient    Methods  Explanation    Comprehension  Verbalized understanding       SLP Short Term Goals - 04/21/17 1235      SLP SHORT TERM GOAL #1   Title  Pt will alternate attention between 2 simple cognitive linguistic tasks with 85% on each and occasional min A    Time  3    Period  Weeks    Status  Achieved      SLP SHORT TERM GOAL #2   Title  Pt will implement 2 environmental/external compensations for reduced attention at work with success over 2 sessions    Baseline  03/27/17; 04/03/17    Time  3    Period  Weeks    Status  Achieved      SLP SHORT TERM GOAL #3   Title  Pt will implement 2 external aids for memory (timer, sticky notes, etc) with success at home or work over 2 sessions    Baseline  03/27/17; 04/03/17    Time  3    Status  Achieved      SLP SHORT TERM GOAL #4   Title  Pt will verbalize 4 compensation for attention with rare min A    Baseline  03/27/17, 04/16/17    Time  1    Status  Achieved       SLP Long Term Goals - 04/21/17 1235      SLP LONG TERM GOAL #1   Title  Pt will divide attention between 2 simple cognitive lingusitic tasks with 80% on each and occasional min A    Time  3    Status  On-going      SLP LONG TERM GOAL #2   Title  Pt will verbalize 5 compensations for attention with rare min A    Time  3    Period  Weeks    Status   On-going      SLP LONG TERM GOAL #3   Title  Pt will impletment 3 compensatory strategies successfully at home/work over 2 sessions - per her report    Time  3    Period  Weeks    Status  On-going      SLP LONG TERM GOAL #4   Title  Pt will complete complex naming tasks with 85% accuracy and rare min A    Time  3    Period  Weeks    Status  On-going       Plan - 04/21/17 1234    Clinical Impression Statement  Pt continues to carryover multiple compensatory strategies at home and work for memory and attention deficits. Extended time remains necessary for alternating and dividing attention activities, and today reports reduced attention in higher level ADL tasks. Continue skilled ST to maximize cognition and carryover of compensations for cognitive impairments. Decrease to 1x per week due to good carryover of compensations.     Speech Therapy Frequency  2x / week    Treatment/Interventions  SLP instruction and feedback;Cognitive reorganization;Internal/external aids;Compensatory strategies;Patient/family education;Functional tasks;Cueing hierarchy;Language facilitation;Environmental controls    Potential Considerations  Financial resources;Family/community support       Patient will benefit from skilled therapeutic intervention in order to improve the following deficits and impairments:   Cognitive communication deficit    Problem List Patient Active Problem List   Diagnosis Date Noted  . PTSD (post-traumatic stress disorder) 02/17/2017  . 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 Essential tremor 07/04/2016  . Postconcussion syndrome 06/10/2016  . Head injury 06/10/2016  . Other complicated headache syndrome 06/10/2016  . Neck pain 06/10/2016  . Decreased body weight 12/07/2014  . 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 Chronic migraine     Kriss Ishler ,MS, CCC-SLP  04/21/2017, 12:36 PM  [facility] [address] [place], [place], [address] Phone: [phone]   Fax:  [phone]  Name: Nelda Bucks MRN: [id number] Date of Birth: 1976-04-14

## 2017-04-24 ENCOUNTER — Encounter: Payer: Self-pay | Admitting: Speech Pathology

## 2017-04-24 ENCOUNTER — Ambulatory Visit: Payer: PRIVATE HEALTH INSURANCE | Admitting: Speech Pathology

## 2017-04-24 ENCOUNTER — Ambulatory Visit: Payer: PRIVATE HEALTH INSURANCE | Admitting: Physical Therapy

## 2017-04-24 ENCOUNTER — Encounter: Payer: Self-pay | Admitting: Physical Therapy

## 2017-04-24 DIAGNOSIS — R41841 Cognitive communication deficit: Secondary | ICD-10-CM

## 2017-04-24 NOTE — Therapy (Signed)
[facility] [address] [place], [place], [address] Phone: [phone]   Fax:  [phone]  Physical Therapy Treatment  Patient Details  Name: Robin Stout MRN: [id number] Date of Birth: January 31, 1976 Referring Provider: Dr. Alger Simons   Encounter Date: 04/24/2017    Past Medical History:  Diagnosis Date  . Liver lesion   . Migraines   . Sigmoid diverticulum     Past Surgical History:  Procedure Laterality Date  . ABDOMINAL HYSTERECTOMY    . abdominal plasty  03/2015  . APPENDECTOMY    . CESAREAN SECTION      There were no vitals filed for this visit.     Pt had speech therapy at 2:00 with PT scheduled at 2:45:  Pt very upset today and talking with speech therapist regarding her holiday/family situation.  Pt had extended time in ST today - did not end session in time for PT and also pt requested to cancel PT due to emotionally not feeling able to participate in PT today.                        PT Short Term Goals - 04/15/17 1517      PT SHORT TERM GOAL #1   Title  Perform SOT and establish goal as appropropriate.    Baseline  baseline 16/100 composite score on 03-10-17:  see LTG    Status  Achieved      PT SHORT TERM GOAL #2   Title  Improve DVA to </= 2 line difference to demo improved gaze stabilization.    Baseline  3 line difference; 2 1/2 line difference on 04-10-17 - pt able to read 1/2 line 2 lines above baseline    Time  4    Period  Weeks    Status  On-going      PT SHORT TERM GOAL #3   Title  Amb. 30' with horizontal head turns with min. LOB and c/o dizziness </= 3/10.    Baseline  dizziness 4/10 on 1st rep, 5/10 on 2nd rep - 04-10-17    Time  4    Period  Weeks    Status  Partially Met      PT SHORT TERM GOAL #4   Title  Improve DHI score by at least 16 points to demo improvement in status and symptoms.    Baseline  BASELINE ESTABLISHED 03/07/17:  58%;             64% on 04-10-17    Time  4    Period  Weeks    Status  On-going      PT SHORT TERM GOAL #5   Title  Independent in HEP for balance, visual and vestibular exercises.    Status  Achieved        PT Long Term Goals - 04/15/17 1517      PT LONG TERM GOAL #1   Title  Improve SOT composite score to WNL's for improved balance.    Status  New      PT LONG TERM GOAL #2   Title  Improve DHI by at least 30 points to demo improvement in status and symptoms.    Status  New      PT LONG TERM GOAL #3   Title  Pt will report ability to run/jog 1 mile with c/o dizziness </= 3/10.    Status  New      PT  LONG TERM GOAL #4   Title  Amb. 64' with horizontal/vertical head turns with minimal sway with c/o dizziness </= 3/10.    Status  New      PT LONG TERM GOAL #5   Title  Pt will report ability to return to working out at least 3 days/week with moderate to min symptoms provoked.    Status  New              Patient will benefit from skilled therapeutic intervention in order to improve the following deficits and impairments:     Visit Diagnosis: Dizziness and giddiness     Problem List Patient Active Problem List   Diagnosis Date Noted  . PTSD (post-traumatic stress disorder) 02/17/2017  . 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 Essential tremor 07/04/2016  . Postconcussion syndrome 06/10/2016  . Head injury 06/10/2016  . Other complicated headache syndrome 06/10/2016  . Neck pain 06/10/2016  . Decreased body weight 12/07/2014  . 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 Chronic migraine     Echo Allsbrook, [place], PT 04/24/2017, 3:23 PM  [facility] [address] [place], [place], [address] Phone: [phone]   Fax:  [phone]  Name: Robin Stout MRN: [id number] Date of Birth: 05-04-1975

## 2017-04-24 NOTE — Therapy (Signed)
[facility] [address] [place], [place], [address] Phone: [phone]   Fax:  [phone]  Speech Language Pathology Treatment  Patient Details  Name: Robin Stout MRN: [id number] Date of Birth: [birth year] Referring Provider: Dr. Faith RogueZachary Swartz   Encounter Date: 04/24/2017  End of Session - 04/24/17 1537    Visit Number  10    Number of Visits  17    Date for SLP Re-Evaluation  05/08/17 extended re-eval date due to missing sessions due to holidays    SLP Start Time  1402    SLP Stop Time   1452    SLP Time Calculation (min)  50 min    Activity Tolerance  Patient tolerated treatment well       Past Medical History:  Diagnosis Date  . Liver lesion   . Migraines   . Sigmoid diverticulum     Past Surgical History:  Procedure Laterality Date  . ABDOMINAL HYSTERECTOMY    . abdominal plasty  03/2015  . APPENDECTOMY    . CESAREAN SECTION      There were no vitals filed for this visit.  Subjective Assessment - 04/24/17 1528    Subjective  "I just couldn't hardly get through Christmas"    Currently in Pain?  -- pain not rated, pt labile and tearful due to family issues            ADULT SLP TREATMENT - 04/24/17 1529      General Information   Behavior/Cognition  Alert;Cooperative      Treatment Provided   Treatment provided  Cognitive-Linquistic      Cognitive-Linquistic Treatment   Treatment focused on  Cognition    Skilled Treatment  Pt tearful today - continued frustration with difficulty attending, as well as with difficulty family situation over Christmas. Pt reports she has started on Lexapro. Provided and reviewed monthly, annual budget chart, as well as chart to list insurances, utilities, gas, car, rent budgets, phone numbers and account numbers for organization of finances at home. Instructed pt to keep account numbers and PINS on the list for ease of locating them when needed. Pt to  continue to make daily  and weekly schedule/to do list at work and home to compensate for attention deficits.       Progression Toward Goals   Progression toward goals  Progressing toward goals       SLP Education - 04/24/17 1534    Education provided  Yes    Education Details  compensations for attention/memory with finances - setting monthly and annual budget. Provided chart for bill/debt organization    Person(s) Educated  Patient    Methods  Explanation;Demonstration;Handout;Verbal cues    Comprehension  Verbalized understanding;Verbal cues required;Need further instruction       SLP Short Term Goals - 04/24/17 1536      SLP SHORT TERM GOAL #1   Title  Pt will alternate attention between 2 simple cognitive linguistic tasks with 85% on each and occasional min A    Time  3    Period  Weeks    Status  Achieved      SLP SHORT TERM GOAL #2   Title  Pt will implement 2 environmental/external compensations for reduced attention at work with success over 2 sessions    Baseline  03/27/17; 04/03/17    Time  3    Period  Weeks    Status  Achieved      SLP SHORT  TERM GOAL #3   Title  Pt will implement 2 external aids for memory (timer, sticky notes, etc) with success at home or work over 2 sessions    Baseline  03/27/17; 04/03/17    Time  3    Status  Achieved      SLP SHORT TERM GOAL #4   Title  Pt will verbalize 4 compensation for attention with rare min A    Baseline  03/27/17, 04/16/17    Time  1    Status  Achieved       SLP Long Term Goals - 04/24/17 1536      SLP LONG TERM GOAL #1   Title  Pt will divide attention between 2 simple cognitive lingusitic tasks with 80% on each and occasional min A    Time  2    Period  Weeks    Status  On-going      SLP LONG TERM GOAL #2   Title  Pt will verbalize 5 compensations for attention with rare min A    Time  2    Period  Weeks    Status  On-going      SLP LONG TERM GOAL #3   Title  Pt will impletment 3 compensatory  strategies successfully at home/work over 2 sessions - per her report    Time  2    Period  Weeks    Status  On-going      SLP LONG TERM GOAL #4   Title  Pt will complete complex naming tasks with 85% accuracy and rare min A    Time  2    Period  Weeks    Status  On-going       Plan - 04/24/17 1535    Clinical Impression Statement  Pt continues to carryover multiple compensatory strategies at home and work for memory and attention deficits. Extended time remains necessary for alternating and dividing attention activities, and today reports reduced attention in higher level ADL tasks. Continue skilled ST to maximize cognition and carryover of compensations for cognitive impairments. Pt continues to be tearful during session, reports feeling overwhelmed and depressed/sad regularly.     Speech Therapy Frequency  1x /week    Treatment/Interventions  SLP instruction and feedback;Cognitive reorganization;Internal/external aids;Compensatory strategies;Patient/family education;Functional tasks;Cueing hierarchy;Language facilitation;Environmental controls    Potential to Achieve Goals  Good    Potential Considerations  Financial resources;Family/community support    Consulted and Agree with Plan of Care  Patient       Patient will benefit from skilled therapeutic intervention in order to improve the following deficits and impairments:   Cognitive communication deficit    Problem List Patient Active Problem List   Diagnosis Date Noted  . PTSD (post-traumatic stress disorder) 02/17/2017  . 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 Essential tremor 07/04/2016  . Postconcussion syndrome 06/10/2016  . Head injury 06/10/2016  . Other complicated headache syndrome 06/10/2016  . Neck pain 06/10/2016  . Decreased body weight 12/07/2014  . 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 Chronic migraine     Ann Bohne, Radene JourneyLaura Ann MS, CCC-SLP 04/24/2017, 3:38 PM  [facility] [address] [place], [place], [address] Phone: [phone]   Fax:  [phone]   Name: Robin Stout MRN: [id number] Date of Birth: 13-Sep-1975

## 2017-04-30 ENCOUNTER — Encounter
Payer: PRIVATE HEALTH INSURANCE | Attending: Physical Medicine & Rehabilitation | Admitting: Physical Medicine & Rehabilitation

## 2017-04-30 ENCOUNTER — Encounter: Payer: Self-pay | Admitting: Physical Medicine & Rehabilitation

## 2017-04-30 VITALS — BP 104/66 | HR 66 | Resp 14

## 2017-04-30 DIAGNOSIS — G43909 Migraine, unspecified, not intractable, without status migrainosus: Secondary | ICD-10-CM | POA: Insufficient documentation

## 2017-04-30 DIAGNOSIS — G894 Chronic pain syndrome: Secondary | ICD-10-CM

## 2017-04-30 DIAGNOSIS — H8110 Benign paroxysmal vertigo, unspecified ear: Secondary | ICD-10-CM | POA: Insufficient documentation

## 2017-04-30 DIAGNOSIS — Z79899 Other long term (current) drug therapy: Secondary | ICD-10-CM

## 2017-04-30 DIAGNOSIS — Z5181 Encounter for therapeutic drug level monitoring: Secondary | ICD-10-CM

## 2017-04-30 DIAGNOSIS — F0781 Postconcussional syndrome: Secondary | ICD-10-CM | POA: Diagnosis not present

## 2017-04-30 DIAGNOSIS — F4310 Post-traumatic stress disorder, unspecified: Secondary | ICD-10-CM | POA: Diagnosis not present

## 2017-04-30 DIAGNOSIS — IMO0002 Reserved for concepts with insufficient information to code with codable children: Secondary | ICD-10-CM

## 2017-04-30 DIAGNOSIS — G43709 Chronic migraine without aura, not intractable, without status migrainosus: Secondary | ICD-10-CM | POA: Diagnosis not present

## 2017-04-30 DIAGNOSIS — F329 Major depressive disorder, single episode, unspecified: Secondary | ICD-10-CM

## 2017-04-30 MED ORDER — SUMATRIPTAN SUCCINATE 100 MG PO TABS: 100.0000 mg | ORAL_TABLET | ORAL | 5 refills | Status: DC | PRN

## 2017-04-30 MED ORDER — METHYLPHENIDATE HCL 5 MG PO TABS: 10.0000 mg | ORAL_TABLET | Freq: Two times a day (BID) | ORAL | 0 refills | Status: DC

## 2017-04-30 MED ORDER — ALPRAZOLAM 0.25 MG PO TABS: 0.2500 mg | ORAL_TABLET | Freq: Two times a day (BID) | ORAL | 0 refills | Status: DC | PRN

## 2017-04-30 MED ORDER — METHYLPHENIDATE HCL 10 MG PO TABS: 10.0000 mg | ORAL_TABLET | Freq: Two times a day (BID) | ORAL | 0 refills | Status: DC

## 2017-04-30 NOTE — Patient Instructions (Signed)
PLEASE FEEL FREE TO CALL OUR OFFICE WITH ANY PROBLEMS OR QUESTIONS ([phone])      

## 2017-04-30 NOTE — Progress Notes (Signed)
Subjective:    Patient ID: Robin Stout, female    DOB: 01-Jan-1976, 42 y.o.   MRN: [id number]  HPI  Robin is here in follow-up of her postconcussion syndrome and associated deficits.  She called prior to Christmas regarding increased depression and anxiety.  I started her on some Lexapro which she feels is beginning to help a bit over the last few days.  The holidays was definitely tough for her given the circumstances.  She complains of chest pain today that started this morning when she first awoke.  She states that she just had a physical and was given a clean bill of health.  Her sleep has improved.  She does not feel that her headaches have improved however and they are regular and intense.  The Maxalt does not seem to be helping for breakthrough symptoms.  She started taking the Ritalin but felt that it actually made her more sleepy.  This afternoon dose seem to be more of a problem in the morning.   Pain Inventory Average Pain 5 Pain Right Now 7 My pain is constant, sharp and dull  In the last 24 hours, has pain interfered with the following? General activity 7 Relation with others 5 Enjoyment of life 7 What TIME of day is your pain at its worst? all Sleep (in general) Good  Pain is worse with: unsure Pain improves with: rest Relief from Meds: 5  Mobility walk without assistance ability to climb steps?  yes do you drive?  yes  Function employed # of hrs/week 37.5  Neuro/Psych tremor dizziness depression anxiety  Prior Studies Any changes since last visit?  no  Physicians involved in your care Any changes since last visit?  no   Family History  Problem Relation Age of Onset  . Multiple sclerosis Mother   . Diabetes Father   . Heart disease Father   . Hypertension Father   . Stroke Father    Social History   Socioeconomic History  . Marital status: Married    Spouse name: None  . Number of children: None  . Years of education: None  . Highest  education level: None  Social Needs  . Financial resource strain: None  . Food insecurity - worry: None  . Food insecurity - inability: None  . Transportation needs - medical: None  . Transportation needs - non-medical: None  Occupational History  . None  Tobacco Use  . Smoking status: Never Smoker  . Smokeless tobacco: Never Used  Substance and Sexual Activity  . Alcohol use: Yes    Alcohol/week: 0.0 oz    Comment: occasional  . Drug use: No  . Sexual activity: None  Other Topics Concern  . None  Social History Narrative  . None   Past Surgical History:  Procedure Laterality Date  . ABDOMINAL HYSTERECTOMY    . abdominal plasty  03/2015  . APPENDECTOMY    . CESAREAN SECTION     Past Medical History:  Diagnosis Date  . Liver lesion   . Migraines   . Sigmoid diverticulum    BP 104/66 (BP Location: Right Arm, Patient Position: Sitting, Cuff Size: Normal)   Pulse 66   Resp 14   SpO2 97%   Opioid Risk Score:   Fall Risk Score:  `1  Depression screen PHQ 2/9  No flowsheet data found.  Review of Systems  Constitutional: Negative.   HENT: Negative.   Eyes: Positive for photophobia and visual disturbance.  Respiratory: Negative.  Cardiovascular: Negative.   Gastrointestinal: Negative.   Endocrine: Negative.   Genitourinary: Negative.   Musculoskeletal: Negative.   Skin: Negative.   Allergic/Immunologic: Negative.   Neurological: Positive for dizziness, tremors and headaches.  Hematological: Negative.        Objective:   Physical Exam General: Patient is alert.  She has the lights turned off in the room because she is photophobic today. HEENT:Extraocular eye movements intact Neck:Supple without JVD or lymphadenopathy Heart:  Regular rate Chest:  Normal effort abdomen:Soft, non-tender, non-distended, bowel sounds positive. Extremities:No clubbing, cyanosis, or edema. Pulses are 2+ Skin:Clean and intact without signs of breakdown Neuro:Pt is  cognitively appropriate with normal insight and awareness. Cranial nerves 2-12 are intact. Sensory exam is normal. Reflexes are 2+ in all 4's.   Gaze is conjugate.  I did not perform vestibular testing today.  She has mild intentional tremor still.  Motor exam is 5 out of 5 bilaterally.    Functional conversationally but still has some issues with attention and focus musculoskeletal:Full ROM, No pain with AROM or PROM in the neck, trunk, or extremities. Posture appropriate Psych:Patient was very quiet and depressed appearing when I first arrived.  She definitely opened up and was laughing at the end of the appointment..     Assessment & Plan:  1. Postconcussion syndrome with persistent higher level cognitive deficits, vertigo, sleep disorder, balance deficits, and headaches.  2. Reactive depression/PTSD 3. History of migraines.      1. Continue trazodone 100mg  qhs.  This dosing seems to be helping her sleep quite a bit. 2.    Maintain Trokendi XR at 150 mg nightly for headaches 3.  Continue with  [facility] PT for vestibular assessment and treagtment 4.  Continue with [facility] SLP for higher level cognitive assessment and remediation.  We discussed some basic strategies for organizational skills today. 5.   Ongoing follow up with  Dr. Arley Phenix for neuro-psych assessment and treatment of PTSD/reactive depression symptoms/coping skillst/etc.    she has found this beneficial so far 6.  Continue Lexapro 10 mg at bedtime.  I also added low-dose Xanax 0.25 for breakthrough anxiety until she makes it through this period 7. Consider titration of primidone or change to Klonopin 8.    Increase Ritalin to 10 mg daily at 7 AM and 12 noon.  I doubt this is causing her fatigue.   9.    For breakthrough migraines will stop Maxalt and begin trial of Imitrex 100 mg as needed.  Consider change to nasal spray or injections if this does not prove effective  20  minutes was spent  with this patient during examination and review of treatment plan. All questions were answered. I will follow up with her in about a month.

## 2017-05-01 ENCOUNTER — Ambulatory Visit: Payer: PRIVATE HEALTH INSURANCE | Attending: Physical Medicine & Rehabilitation | Admitting: Physical Therapy

## 2017-05-01 ENCOUNTER — Encounter: Payer: Self-pay | Admitting: Physical Therapy

## 2017-05-01 ENCOUNTER — Telehealth: Payer: Self-pay

## 2017-05-01 DIAGNOSIS — R29818 Other symptoms and signs involving the nervous system: Secondary | ICD-10-CM | POA: Insufficient documentation

## 2017-05-01 DIAGNOSIS — R41841 Cognitive communication deficit: Secondary | ICD-10-CM | POA: Insufficient documentation

## 2017-05-01 DIAGNOSIS — R42 Dizziness and giddiness: Secondary | ICD-10-CM | POA: Diagnosis present

## 2017-05-01 DIAGNOSIS — R262 Difficulty in walking, not elsewhere classified: Secondary | ICD-10-CM | POA: Diagnosis present

## 2017-05-01 DIAGNOSIS — R2681 Unsteadiness on feet: Secondary | ICD-10-CM | POA: Insufficient documentation

## 2017-05-01 NOTE — Telephone Encounter (Signed)
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
## 2017-05-01 NOTE — Telephone Encounter (Signed)
Recieved denial of prior auth today 05-01-2017, filing appeal of denial today

## 2017-05-05 NOTE — Telephone Encounter (Signed)
Prior Berkley Harveyauth appeal has returned today APPROVED for 1 year.  Patient notified.

## 2017-05-08 ENCOUNTER — Encounter: Payer: Self-pay | Admitting: Speech Pathology

## 2017-05-08 ENCOUNTER — Ambulatory Visit: Payer: PRIVATE HEALTH INSURANCE | Admitting: Speech Pathology

## 2017-05-08 ENCOUNTER — Other Ambulatory Visit: Payer: Self-pay

## 2017-05-08 DIAGNOSIS — R41841 Cognitive communication deficit: Secondary | ICD-10-CM

## 2017-05-08 DIAGNOSIS — R2681 Unsteadiness on feet: Secondary | ICD-10-CM | POA: Diagnosis not present

## 2017-05-08 NOTE — Therapy (Signed)
[facility] [address] [place], [place], [address] Phone: [phone]   Fax:  [phone]  Speech Language Pathology Treatment  Patient Details  Name: Robin Stout MRN: [id number] Date of Birth: Jan 06, 1976 Referring Provider: Dr. Alger Simons   Encounter Date: 05/08/2017  End of Session - 05/08/17 1607    Visit Number  11    Number of Visits  17    Date for SLP Re-Evaluation  05/08/17    SLP Start Time  1228    SLP Stop Time   1317    SLP Time Calculation (min)  49 min    Activity Tolerance  Patient tolerated treatment well       Past Medical History:  Diagnosis Date  . Liver lesion   . Migraines   . Sigmoid diverticulum     Past Surgical History:  Procedure Laterality Date  . ABDOMINAL HYSTERECTOMY    . abdominal plasty  03/2015  . APPENDECTOMY    . CESAREAN SECTION      There were no vitals filed for this visit.  Subjective Assessment - 05/08/17 1602    Subjective  "10 miligrams of Ritalin has made such a difference, where 5 mg did nothting"            ADULT SLP TREATMENT - 05/08/17 1246      General Information   Behavior/Cognition  Alert;Cooperative      Treatment Provided   Treatment provided  Cognitive-Linquistic      Cognitive-Linquistic Treatment   Treatment focused on  Cognition    Skilled Treatment  Pt reports carryingover safety, organization strategies and compensations for attention. Pt reports her Ritalin is helping her attention significantly.  Pt reports she is staying on task at work better with the Ritalin. She continues to carryover lists, has started projects at work with success. Pt is using consistent routine to manage household chores. Pt divided attention between moderately complex deduction puzzle while completing verbal naming tasks in timely manner with 95% on deduction and 100% on naming task..Pt named 5 compensations for attention impairment with mod I.       Assessment / Recommendations / Plan   Plan  Continue with current plan of care      Progression Toward Goals   Progression toward goals  Goals met, education completed, patient discharged from [facility] - 05/08/17 1603    Education provided  Yes    Education Details  Continue excellent follow through with compensations for attention    Person(s) Educated  Patient    Methods  Explanation    Comprehension  Verbalized understanding;Returned demonstration      SPEECH THERAPY DISCHARGE SUMMARY  Visits from Start of Care: 11  Current functional level related to goals / functional outcomes: See goals below   Remaining deficits: Attention, cognitive lingusitic   Education / Equipment: Compensations for attention/concentration in home and work, post concussion ed,  Plan: Patient agrees to discharge.  Patient goals were met. Patient is being discharged due to meeting the stated rehab goals.  ?????      SLP Short Term Goals - 05/08/17 1606      SLP SHORT TERM GOAL #1   Title  Pt will alternate attention between 2 simple cognitive linguistic tasks with 85% on each and occasional min A    Time  3    Period  Weeks    Status  Achieved  SLP SHORT TERM GOAL #2   Title  Pt will implement 2 environmental/external compensations for reduced attention at work with success over 2 sessions    Baseline  03/27/17; 04/03/17    Time  3    Period  Weeks    Status  Achieved      SLP SHORT TERM GOAL #3   Title  Pt will implement 2 external aids for memory (timer, sticky notes, etc) with success at home or work over 2 sessions    Baseline  03/27/17; 04/03/17    Time  3    Status  Achieved      SLP SHORT TERM GOAL #4   Title  Pt will verbalize 4 compensation for attention with rare min A    Baseline  03/27/17, 04/16/17    Time  1    Status  Achieved       SLP Long Term Goals - 05/08/17 1607      SLP LONG TERM GOAL #1   Title  Pt will divide attention between 2 simple  cognitive lingusitic tasks with 80% on each and occasional min A    Time  2    Period  Weeks    Status  Achieved      SLP LONG TERM GOAL #2   Title  Pt will verbalize 5 compensations for attention with rare min A    Time  2    Period  Weeks    Status  Achieved      SLP LONG TERM GOAL #3   Title  Pt will impletment 3 compensatory strategies successfully at home/work over 2 sessions - per her report    Time  2    Period  Weeks    Status  Achieved      SLP LONG TERM GOAL #4   Title  Pt will complete complex naming tasks with 85% accuracy and rare min A    Time  2    Period  Weeks    Status  Achieved       Plan - 05/08/17 1604    Clinical Impression Statement  [name] has consistently demonstrated excellent carryover of compensations for reduced attention. She verbalizes that the Lexapro and Ritalin are also making a difference in her mood and attention. Pt was not tearful this session. She has met all ST goals, d/c ST at this time. Pt in agreement    Speech Therapy Frequency  1x /week    Treatment/Interventions  SLP instruction and feedback;Cognitive reorganization;Internal/external aids;Compensatory strategies;Patient/family education;Functional tasks;Cueing hierarchy;Language facilitation;Environmental controls    Potential to Achieve Goals  Good    Potential Considerations  Financial resources;Family/community support    Consulted and Agree with Plan of Care  Patient       Patient will benefit from skilled therapeutic intervention in order to improve the following deficits and impairments:   Cognitive communication deficit    Problem List Patient Active Problem List   Diagnosis Date Noted  . PTSD (post-traumatic stress disorder) 02/17/2017  . 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 Essential tremor 07/04/2016  . Postconcussion syndrome 06/10/2016  . Head injury 06/10/2016  . Other complicated headache syndrome 06/10/2016  . Neck pain 06/10/2016  . Decreased body weight  12/07/2014  . 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 Chronic migraine     Lovvorn, Annye Rusk MS, CCC-SLP 05/08/2017, 4:08 PM  [facility] [address] [place], [place], [address] Phone: [phone]   Fax:  [phone]  Name: Robin Stout MRN: [id number] Date of Birth: 1976-04-14

## 2017-05-13 ENCOUNTER — Ambulatory Visit: Payer: PRIVATE HEALTH INSURANCE

## 2017-05-13 ENCOUNTER — Ambulatory Visit: Payer: PRIVATE HEALTH INSURANCE | Admitting: Physical Therapy

## 2017-05-13 DIAGNOSIS — R2681 Unsteadiness on feet: Secondary | ICD-10-CM | POA: Diagnosis not present

## 2017-05-13 DIAGNOSIS — R42 Dizziness and giddiness: Secondary | ICD-10-CM

## 2017-05-14 ENCOUNTER — Encounter: Payer: Self-pay | Admitting: Physical Therapy
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
## 2017-05-19 ENCOUNTER — Telehealth: Payer: Self-pay

## 2017-05-19 MED ORDER — ESCITALOPRAM OXALATE 10 MG PO TABS: 20.0000 mg | ORAL_TABLET | Freq: Every day | ORAL | 3 refills | Status: DC

## 2017-05-19 NOTE — Telephone Encounter (Signed)
I increase Lexapro to 20 mg daily at bedtime and prescription was E scribed to pharmacy

## 2017-05-19 NOTE — Telephone Encounter (Signed)
Patient called back, advised her to increase her current medication from 1 a night to two a night until she runs low then pick up her new prescription at her convience.

## 2017-05-19 NOTE — Telephone Encounter (Signed)
Patient called, stated that lexapro has lost its effectiveness and is hoping there can be something that can help.

## 2017-05-22 ENCOUNTER — Encounter: Payer: Self-pay | Admitting: Physical Therapy

## 2017-05-22 ENCOUNTER — Encounter: Payer: Self-pay | Admitting: Speech Pathology

## 2017-05-22 ENCOUNTER — Ambulatory Visit: Payer: PRIVATE HEALTH INSURANCE | Admitting: Physical Therapy

## 2017-05-22 DIAGNOSIS — R2681 Unsteadiness on feet: Secondary | ICD-10-CM

## 2017-05-22 DIAGNOSIS — R42 Dizziness and giddiness: Secondary | ICD-10-CM
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
## 2017-05-23 ENCOUNTER — Encounter: Payer: PRIVATE HEALTH INSURANCE | Admitting: Psychology

## 2017-05-23 DIAGNOSIS — F4310 Post-traumatic stress disorder, unspecified: Secondary | ICD-10-CM | POA: Diagnosis not present

## 2017-05-23 DIAGNOSIS — G894 Chronic pain syndrome: Secondary | ICD-10-CM | POA: Diagnosis not present

## 2017-05-23 DIAGNOSIS — F0781 Postconcussional syndrome: Secondary | ICD-10-CM

## 2017-05-23 DIAGNOSIS — F329 Major depressive disorder, single episode, unspecified: Secondary | ICD-10-CM

## 2017-05-27 ENCOUNTER — Ambulatory Visit: Payer: Self-pay | Admitting: Psychology

## 2017-05-28 ENCOUNTER — Encounter: Payer: Self-pay | Admitting: Rehabilitative and Restorative Service Providers"

## 2017-05-28 ENCOUNTER — Ambulatory Visit: Payer: PRIVATE HEALTH INSURANCE | Admitting: Rehabilitative and Restorative Service Providers"

## 2017-05-28 DIAGNOSIS — R2681 Unsteadiness on feet: Secondary | ICD-10-CM

## 2017-05-28 DIAGNOSIS — R262 Difficulty in walking, not elsewhere classified: Secondary | ICD-10-CM

## 2017-05-28 DIAGNOSIS — R29818 Other symptoms and signs involving the nervous system: Secondary | ICD-10-CM

## 2017-05-28 DIAGNOSIS — R42 Dizziness and giddiness: Secondary | ICD-10-CM
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
## 2017-05-30 ENCOUNTER — Encounter: Payer: Self-pay | Admitting: Psychology
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
## 2017-06-02 ENCOUNTER — Ambulatory Visit: Payer: PRIVATE HEALTH INSURANCE | Admitting: Physical Therapy

## 2017-06-02 ENCOUNTER — Encounter
Payer: PRIVATE HEALTH INSURANCE | Attending: Physical Medicine & Rehabilitation | Admitting: Physical Medicine & Rehabilitation

## 2017-06-02 ENCOUNTER — Encounter: Payer: Self-pay | Admitting: Physical Medicine & Rehabilitation

## 2017-06-02 DIAGNOSIS — F329 Major depressive disorder, single episode, unspecified: Secondary | ICD-10-CM | POA: Diagnosis not present

## 2017-06-02 DIAGNOSIS — F0781 Postconcussional syndrome: Secondary | ICD-10-CM | POA: Diagnosis present

## 2017-06-02 DIAGNOSIS — G43909 Migraine, unspecified, not intractable, without status migrainosus: Secondary | ICD-10-CM | POA: Insufficient documentation

## 2017-06-02 DIAGNOSIS — H8110 Benign paroxysmal vertigo, unspecified ear: Secondary | ICD-10-CM | POA: Insufficient documentation

## 2017-06-02 DIAGNOSIS — F4310 Post-traumatic stress disorder, unspecified: Secondary | ICD-10-CM | POA: Diagnosis not present

## 2017-06-02 DIAGNOSIS — R251 Tremor, unspecified: Secondary | ICD-10-CM

## 2017-06-02 MED ORDER — CARBIDOPA-LEVODOPA 10-100 MG PO TABS: 1.0000 | ORAL_TABLET | Freq: Three times a day (TID) | ORAL | 3 refills | Status: DC

## 2017-06-02 MED ORDER — TRAZODONE HCL 50 MG PO TABS: 50.0000 mg | ORAL_TABLET | Freq: Every day | ORAL | 5 refills | Status: DC

## 2017-06-02 MED ORDER — TOPIRAMATE ER 50 MG PO CAP24: 100.0000 mg | ORAL_CAPSULE | Freq: Every day | ORAL | 3 refills | Status: DC

## 2017-06-02 MED ORDER — METHYLPHENIDATE HCL 10 MG PO TABS: 10.0000 mg | ORAL_TABLET | Freq: Two times a day (BID) | ORAL | 0 refills | Status: DC
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
## 2017-06-02 NOTE — Patient Instructions (Signed)
PLEASE FEEL FREE TO CALL OUR OFFICE WITH ANY PROBLEMS OR QUESTIONS [phone]     PRIMIDONE: DAYS 1-4: REDUCE PRIMIDONE TO 50MG  TWICE DAILY   DAYS 5-9:    BEGIN SINEMET TWICE DAILY    :CONTINUE SAME PRIMIDONE DOSING  DAYS 10-14:                -STOP PRIMIDONE   -INCREASE SINEMET TO THREE X DAILY

## 2017-06-06 ENCOUNTER — Ambulatory Visit
Payer: PRIVATE HEALTH INSURANCE | Attending: Physical Medicine & Rehabilitation | Admitting: Rehabilitative and Restorative Service Providers"

## 2017-06-06 ENCOUNTER — Encounter: Payer: Self-pay | Admitting: Rehabilitative and Restorative Service Providers"

## 2017-06-06 DIAGNOSIS — R29818 Other symptoms and signs involving the nervous system: Secondary | ICD-10-CM | POA: Diagnosis present

## 2017-06-06 DIAGNOSIS — R42 Dizziness and giddiness: Secondary | ICD-10-CM | POA: Diagnosis not present

## 2017-06-06 DIAGNOSIS — R262 Difficulty in walking, not elsewhere classified: Secondary | ICD-10-CM | POA: Diagnosis present

## 2017-06-06 DIAGNOSIS — R2681 Unsteadiness on feet: Secondary | ICD-10-CM | POA: Insufficient documentation
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
## 2017-06-09 ENCOUNTER — Ambulatory Visit: Payer: PRIVATE HEALTH INSURANCE | Admitting: Physical Therapy

## 2017-06-09 DIAGNOSIS — R42 Dizziness and giddiness: Secondary | ICD-10-CM | POA: Diagnosis not present

## 2017-06-09 DIAGNOSIS — R2681 Unsteadiness on feet: Secondary | ICD-10-CM

## 2017-06-10 ENCOUNTER — Encounter: Payer: Self-pay | Admitting: Physical Therapy
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
## 2017-06-12 ENCOUNTER — Encounter: Payer: Self-pay | Admitting: Psychology

## 2017-06-12 ENCOUNTER — Encounter: Payer: PRIVATE HEALTH INSURANCE | Admitting: Psychology

## 2017-06-12 DIAGNOSIS — F4310 Post-traumatic stress disorder, unspecified: Secondary | ICD-10-CM | POA: Diagnosis not present

## 2017-06-12 DIAGNOSIS — F0781 Postconcussional syndrome: Secondary | ICD-10-CM | POA: Diagnosis not present

## 2017-06-12 DIAGNOSIS — F329 Major depressive disorder, single episode, unspecified: Secondary | ICD-10-CM

## 2017-06-12 DIAGNOSIS — G894 Chronic pain syndrome: Secondary | ICD-10-CM | POA: Diagnosis not present
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
## 2017-06-17 ENCOUNTER — Ambulatory Visit: Payer: PRIVATE HEALTH INSURANCE | Admitting: Physical Therapy

## 2017-06-17 DIAGNOSIS — R2681 Unsteadiness on feet: Secondary | ICD-10-CM

## 2017-06-17 DIAGNOSIS — R42 Dizziness and giddiness: Secondary | ICD-10-CM

## 2017-06-18 ENCOUNTER — Encounter: Payer: Self-pay | Admitting: Physical Therapy
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
## 2017-06-30 ENCOUNTER — Ambulatory Visit: Payer: PRIVATE HEALTH INSURANCE | Attending: Physical Medicine & Rehabilitation | Admitting: Physical Therapy

## 2017-06-30 ENCOUNTER — Encounter
Payer: PRIVATE HEALTH INSURANCE | Attending: Physical Medicine & Rehabilitation | Admitting: Physical Medicine & Rehabilitation

## 2017-06-30 ENCOUNTER — Encounter: Payer: Self-pay | Admitting: Physical Medicine & Rehabilitation

## 2017-06-30 VITALS — BP 116/77 | HR 83

## 2017-06-30 DIAGNOSIS — G43909 Migraine, unspecified, not intractable, without status migrainosus: Secondary | ICD-10-CM | POA: Insufficient documentation

## 2017-06-30 DIAGNOSIS — F4310 Post-traumatic stress disorder, unspecified: Secondary | ICD-10-CM | POA: Diagnosis not present

## 2017-06-30 DIAGNOSIS — R2681 Unsteadiness on feet: Secondary | ICD-10-CM | POA: Insufficient documentation

## 2017-06-30 DIAGNOSIS — G894 Chronic pain syndrome: Secondary | ICD-10-CM

## 2017-06-30 DIAGNOSIS — H8110 Benign paroxysmal vertigo, unspecified ear: Secondary | ICD-10-CM | POA: Insufficient documentation

## 2017-06-30 DIAGNOSIS — R42 Dizziness and giddiness: Secondary | ICD-10-CM | POA: Diagnosis not present

## 2017-06-30 DIAGNOSIS — R29818 Other symptoms and signs involving the nervous system: Secondary | ICD-10-CM | POA: Insufficient documentation

## 2017-06-30 DIAGNOSIS — F329 Major depressive disorder, single episode, unspecified: Secondary | ICD-10-CM | POA: Insufficient documentation

## 2017-06-30 DIAGNOSIS — F0781 Postconcussional syndrome: Secondary | ICD-10-CM

## 2017-06-30 MED ORDER — CARBIDOPA-LEVODOPA 25-100 MG PO TABS: 1.0000 | ORAL_TABLET | Freq: Three times a day (TID) | ORAL | 2 refills | Status: DC

## 2017-06-30 MED ORDER — METHYLPHENIDATE HCL 10 MG PO TABS: 10.0000 mg | ORAL_TABLET | Freq: Two times a day (BID) | ORAL | 0 refills | Status: DC

## 2017-06-30 NOTE — Progress Notes (Signed)
Subjective:    Patient ID: Robin Stout, female    DOB: [birth year], 42 y.o.   MRN: [id number]  HPI   Robin Stout is here in follow-up of her postconcussion syndrome.  Last month when I saw her we reduced her topiramate and stopped her primidone, placing her on a trial of Sinemet 10/100.  She feels over the last month that her headaches have been more severe and that her tremors have been worse also.  She reports having for significant headaches out of 7 days last week.  She has been better however emotionally and cognitively speaking.  Sleep is been reasonable.  Robin Stout remains on Ritalin for her attention and notices a big difference with that.  She is using Imitrex for breakthrough headaches.  She is on Lexapro 20 mg at night for mood and trazodone 50 mg at night for sleep.  She has not used Xanax yet for anxiety.  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  Pain Inventory Average Pain 4 Pain Right Now 3 My pain is sharp and dull  In the last 24 hours, has pain interfered with the following? General activity 5 Relation with others 5 Enjoyment of life 5 What TIME of day is your pain at its worst? all Sleep (in general) Fair  Pain is worse with: some activites Pain improves with: medication Relief from Meds: 7  Mobility walk without assistance  Function Do you have any goals in this area?  no  Neuro/Psych tremor dizziness depression anxiety  Prior Studies Any changes since last visit?  no  Physicians involved in your care Any changes since last visit?  no   Family History  Problem Relation Age of Onset  . Multiple sclerosis Mother   . Diabetes Father   . Heart disease Father   . Hypertension Father   . Stroke Father    Social History    Socioeconomic History  . Marital status: Married    Spouse name: Not on file  . Number of children: Not on file  . Years of education: Not on file  . Highest education level: Not on file  Social Needs  . Financial resource strain: Not on file  . Food insecurity - worry: Not on file  . Food insecurity - inability: Not on file  . Transportation needs - medical: Not on file  . Transportation needs - non-medical: Not on file  Occupational History  . Not on file  Tobacco Use  . Smoking status: Never Smoker  . Smokeless tobacco: Never Used  Substance and Sexual Activity  . Alcohol use: Yes    Alcohol/week: 0.0 oz    Comment: occasional  . Drug use: No  . Sexual activity: Not on file  Other Topics Concern  . Not on file  Social History Narrative  . Not on file   Past Surgical History:  Procedure Laterality Date  . ABDOMINAL HYSTERECTOMY    . abdominal plasty  03/2015  . APPENDECTOMY    . CESAREAN SECTION     Past Medical History:  Diagnosis Date  . Liver lesion   . Migraines   . Sigmoid diverticulum    There were no vitals taken for this visit.  Opioid Risk Score:   Fall Risk Score:  `  1  Depression screen PHQ 2/9  No flowsheet data found.   Review of Systems  Constitutional: Negative.   HENT: Negative.   Eyes: Negative.   Respiratory: Negative.   Cardiovascular: Negative.   Gastrointestinal: Positive for nausea.  Endocrine: Negative.   Genitourinary: Negative.   Musculoskeletal: Negative.   Skin: Negative.   Allergic/Immunologic: Negative.   Neurological: Positive for dizziness and tremors.  Hematological: Negative.   Psychiatric/Behavioral: Positive for dysphoric mood. The patient is nervous/anxious.   All other systems reviewed and are negative.      Objective:   Physical Exam  General:Patient is alert. no distress HEENT:Extraocular eye movements intact Neck:Supple without JVD or lymphadenopathy Heart:RRR Chest:normal  effort abdomen:Soft, non-tender, non-distended, bowel sounds positive. Extremities:No clubbing, cyanosis, or edema. Pulses are 2+ Skin:Clean and intact without signs of breakdown Neuro:Pt is cognitively appropriate with normal insight and awareness. Cranial nerves 2-12 are intact. Sensory exam is normal. Reflexes are 2+ in all 4's.Gaze is conjugate. I did not perform vestibular testing today.noticeable tremor worse with intentional movements today. Motor exam is 5 out of 5 bilaterally.attention and focus better  Musculoskeletal: good posture, ROM Psych: generally  Pleasant, becomes tearful at times      Assessment & Plan:  1. Postconcussion syndrome with persistent higher level cognitive deficits, vertigo, sleep disorder, balance deficits, and headaches.  2. Reactive depression/PTSD 3. History of migraines.      1.Continue trazodone 50mg  qhs for sleep. Marland Kitchen. 2.ContinueTrokendi XR to100 mg nightlyfor headaches. consider titration back to 150mg  given headache increase 3.Continue with Robin Stout PT for vestibular assessment and treagtment--she is seeing benefit 4.Continue withMC Stout SLP for higher level cognitive assessment and remediation.  5.Ongoing follow up withDr. Arley PhenixJohn Robin Stout for neuro-psych assessment and treatment of PTSD/reactive depression symptoms/coping skillst/etc.coping remains an issue 6.Continue Lexapro 20 mg at bedtime.     -low-dose Xanax 0.25 for breakthrough anxiety only. 7. Increase sinemet for tremors. To 25/100 tid,   consider clonazepam trial. If all else fails, could resume primidone 8.maintain Ritalin at 10 mg daily at 7 AM and 12 noon. #60 9.continuef Imitrex 100 mg as needed for breakthrough migraine. ?botox candidate   25minuteswas spent with this patient during examination and review of treatment plan. All questions were answered. I will follow up with her in about 1 month

## 2017-06-30 NOTE — Patient Instructions (Signed)
PLEASE FEEL FREE TO CALL OUR OFFICE WITH ANY PROBLEMS OR QUESTIONS ([phone])      

## 2017-07-01 ENCOUNTER — Encounter: Payer: Self-pay | Admitting: Physical Therapy
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
## 2017-07-07 ENCOUNTER — Ambulatory Visit: Payer: PRIVATE HEALTH INSURANCE | Admitting: Physical Therapy

## 2017-07-07 DIAGNOSIS — R42 Dizziness and giddiness: Secondary | ICD-10-CM

## 2017-07-07 DIAGNOSIS — R2681 Unsteadiness on feet: Secondary | ICD-10-CM

## 2017-07-08 ENCOUNTER — Encounter: Payer: Self-pay | Admitting: Physical Therapy
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
## 2017-07-10 ENCOUNTER — Other Ambulatory Visit: Payer: Self-pay | Admitting: Physical Medicine & Rehabilitation

## 2017-07-10 DIAGNOSIS — IMO0002 Reserved for concepts with insufficient information to code with codable children: Secondary | ICD-10-CM

## 2017-07-10 DIAGNOSIS — G43709 Chronic migraine without aura, not intractable, without status migrainosus: Secondary | ICD-10-CM

## 2017-07-14 ENCOUNTER — Ambulatory Visit: Payer: PRIVATE HEALTH INSURANCE | Admitting: Physical Therapy

## 2017-07-14 DIAGNOSIS — R42 Dizziness and giddiness: Secondary | ICD-10-CM

## 2017-07-14 DIAGNOSIS — R2681 Unsteadiness on feet: Secondary | ICD-10-CM

## 2017-07-15 ENCOUNTER — Encounter: Payer: Self-pay | Admitting: Physical Therapy

## 2017-07-15 NOTE — Therapy (Addendum)
[facility] [address] [place], [place], [address] Phone: [phone]   Fax:  [phone]  Physical Therapy Treatment  Patient Details  Name: Robin Stout MRN: [id number] Date of Birth: 1976/04/11 Referring Provider: Dr. Alger Simons   Encounter Date: 07/14/2017  PT End of Session - 07/15/17 2155    Visit Number  20    Number of Visits  22    Authorization Type  [employer]    Authorization - Visit Number  10    Authorization - Number of Visits  19    PT Start Time  3559    PT Stop Time  7416    PT Time Calculation (min)  42 min       Past Medical History:  Diagnosis Date  . Liver lesion   . Migraines   . Sigmoid diverticulum     Past Surgical History:  Procedure Laterality Date  . ABDOMINAL HYSTERECTOMY    . abdominal plasty  03/2015  . APPENDECTOMY    . CESAREAN SECTION      There were no vitals filed for this visit.  Subjective Assessment - 07/14/17 1543    Currently in Pain?  Yes    Pain Score  4  4-4.5/10    Pain Location  Head    Pain Orientation  -- headache    Pain Descriptors / Indicators  Headache    Pain Onset  More than a month ago    Pain Frequency  Intermittent               Standing Eyes Opened  Wide (BOA);Narrow base of support (BOS);Foam/compliant surface;5 reps;Head turns horizontal and vertical head turns    Standing Eyes Closed  Wide (BOA);Foam/compliant surface;Head turns;5 reps    Tandem Stance  Eyes open;Foam/compliant surface;2 reps;10 secs    Turning  Both;5 reps picking up cones off floor and placing in cabinet            Neuro Re-ed Details   Limits of stability on SOT; moving support and moving surround; last exercise - screen turned off and had a letter on R side and one on left side - moving support and surround - pt looking at letters for targets for improved gaze stabilization                                 Pt performed amb. With ball - making  circles clockwise, counterclockwise, "V" and "t" directions for improved oculomotor function With dynamic gait  Rockerboard with head turns - horizontal and vertical 5 reps each with UE support prn Also performed rockerboard with EC - no head movement - with UE support prn with SBA             PT Short Term Goals - 04/15/17 1517      PT SHORT TERM GOAL #1   Title  Perform SOT and establish goal as appropropriate.    Baseline  baseline 16/100 composite score on 03-10-17:  see LTG    Status  Achieved      PT SHORT TERM GOAL #2   Title  Improve DVA to </= 2 line difference to demo improved gaze stabilization.    Baseline  3 line difference; 2 1/2 line difference on 04-10-17 - pt able to read 1/2 line 2 lines above baseline    Time  4    Period  Weeks  Status  On-going      PT SHORT TERM GOAL #3   Title  Amb. 30' with horizontal head turns with min. LOB and c/o dizziness </= 3/10.    Baseline  dizziness 4/10 on 1st rep, 5/10 on 2nd rep - 04-10-17    Time  4    Period  Weeks    Status  Partially Met      PT SHORT TERM GOAL #4   Title  Improve DHI score by at least 16 points to demo improvement in status and symptoms.    Baseline  BASELINE ESTABLISHED 03/07/17:  58%;            64% on 04-10-17    Time  4    Period  Weeks    Status  On-going      PT SHORT TERM GOAL #5   Title  Independent in HEP for balance, visual and vestibular exercises.    Status  Achieved        PT Long Term Goals - 07/01/17 2016      PT LONG TERM GOAL #1   Title  Improve SOT composite score to WNL's for improved balance.    Baseline  Composite score 67/100  (N= 70/100); retested on 06/06/17 and patient scored 55% demonstrating a DECLINE in performance since last assessment.    Status  On-going    Target Date  08/04/17      PT LONG TERM GOAL #2   Title  Improve DHI by at least 30 points to demo improvement in status and symptoms.  Goal revised to </= 30% ;    Baseline  score on 05-22-17 46%:   03-04-17 score 58%:  04-10-17 score 64% with no answers of "sometimes"    Time  4    Period  Weeks    Status  On-going    Target Date  08/04/17      PT LONG TERM GOAL #3   Title  Pt will report ability to run/jog 1 mile with c/o dizziness </= 3/10.    Baseline  pt reports headaches have been of moderate intensity (4/10);  pt reports attempting to jog less than 1/2 mile 2x within past week - states it increased her headache but she was able to jog    Time  4    Period  Weeks    Status  On-going      PT LONG TERM GOAL #4   Title  Amb. 73' with horizontal/vertical head turns with minimal sway with c/o dizziness </= 3/10.    Baseline  vertical 3.5 /10 c/o dizziness:  horizontal head turns provoked dizziness at 3/10 (40' amb. distance)     Time  4    Period  Weeks    Status  On-going    Target Date  08/04/17      PT LONG TERM GOAL #5   Title  Pt will report ability to return to working out at least 3 days/week with moderate to min symptoms provoked.    Baseline  pt reports working out 1 day/week -- moderate symptoms due to less exertional activities (brisk walks)     Time  4    Period  Weeks    Status  On-going    Target Date  08/04/17      PT LONG TERM GOAL #6   Title  Pt will perform ambulation with visual tracking of ball 230' around clinic gym with dizziness </= 2/10 intensity with min. sway/unsteadiness.  Time  4    Period  Weeks    Status  On-going    Target Date  08/04/17      PT LONG TERM GOAL #7   Title  Pt will spontaneously hold head upright when walking from lobby to clinic gym without need for cues.    Time  4    Period  Weeks    Status  On-going    Target Date  08/04/17              Patient will benefit from skilled therapeutic intervention in order to improve the following deficits and impairments:     Visit Diagnosis: Dizziness and giddiness  Unsteadiness on feet     Problem List Patient Active Problem List   Diagnosis Date Noted  .  Tremor 06/02/2017  . PTSD (post-traumatic stress disorder) 02/17/2017  . 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 Essential tremor 07/04/2016  . Postconcussion syndrome 06/10/2016  . Head injury 06/10/2016  . Other complicated headache syndrome 06/10/2016  . Neck pain 06/10/2016  . Decreased body weight 12/07/2014  . 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 Chronic migraine     Lawren Sexson, [place], PT 07/15/2017, 9:56 PM  [facility] [address] [place], [place], [address] Phone: [phone]   Fax:  [phone]  Name: Robin Stout MRN: [id number] Date of Birth: Apr 28, 1976

## 2017-07-28 ENCOUNTER — Ambulatory Visit: Payer: PRIVATE HEALTH INSURANCE | Attending: Physical Medicine & Rehabilitation | Admitting: Physical Therapy

## 2017-07-28 DIAGNOSIS — R42 Dizziness and giddiness: Secondary | ICD-10-CM | POA: Insufficient documentation

## 2017-07-28 DIAGNOSIS — R2681 Unsteadiness on feet: Secondary | ICD-10-CM | POA: Diagnosis not present

## 2017-07-29 ENCOUNTER — Encounter: Payer: Self-pay | Admitting: Physical Therapy
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
## 2017-07-30 ENCOUNTER — Encounter
Payer: PRIVATE HEALTH INSURANCE | Attending: Physical Medicine & Rehabilitation | Admitting: Physical Medicine & Rehabilitation

## 2017-07-30 ENCOUNTER — Encounter: Payer: Self-pay | Admitting: Physical Medicine & Rehabilitation

## 2017-07-30 VITALS — BP 113/69 | HR 82 | Ht 61.0 in | Wt 148.0 lb

## 2017-07-30 DIAGNOSIS — G4459 Other complicated headache syndrome: Secondary | ICD-10-CM

## 2017-07-30 DIAGNOSIS — R251 Tremor, unspecified: Secondary | ICD-10-CM | POA: Diagnosis not present

## 2017-07-30 DIAGNOSIS — Z79899 Other long term (current) drug therapy: Secondary | ICD-10-CM

## 2017-07-30 DIAGNOSIS — F0781 Postconcussional syndrome: Secondary | ICD-10-CM | POA: Diagnosis not present

## 2017-07-30 DIAGNOSIS — F329 Major depressive disorder, single episode, unspecified: Secondary | ICD-10-CM

## 2017-07-30 DIAGNOSIS — Z5181 Encounter for therapeutic drug level monitoring: Secondary | ICD-10-CM

## 2017-07-30 DIAGNOSIS — G43909 Migraine, unspecified, not intractable, without status migrainosus: Secondary | ICD-10-CM | POA: Insufficient documentation

## 2017-07-30 DIAGNOSIS — H8110 Benign paroxysmal vertigo, unspecified ear: Secondary | ICD-10-CM | POA: Insufficient documentation

## 2017-07-30 DIAGNOSIS — F4310 Post-traumatic stress disorder, unspecified: Secondary | ICD-10-CM

## 2017-07-30 MED ORDER — METHYLPHENIDATE HCL 10 MG PO TABS: 5.0000 mg | ORAL_TABLET | Freq: Three times a day (TID) | ORAL | 0 refills | Status: DC

## 2017-07-30 MED ORDER — TRAZODONE HCL 50 MG PO TABS: 50.0000 mg | ORAL_TABLET | Freq: Every evening | ORAL | 5 refills | Status: DC | PRN

## 2017-07-30 NOTE — Progress Notes (Signed)
Subjective:    Patient ID: Robin Stout, female    DOB: July 21, 1975, 42 y.o.   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  She states that overall she is making improvements.  She feels that the increase in Sinemet has helped her tremors quite a bit.  They are not gone but certainly have decreased.  She notes that the Ritalin has continued to help with her focus and cognition.  She does note that at times the medicine seems to wear off around 5:00 or so when she still needs to function at a high level for work or another situation.  We had decreased her Trokendi earlier this year because of fatigue but since then her headaches seem to have been more severe.  She tells me that she has been sleeping very well as of late and thinks that perhaps she can come off the trazodone.  Her mood has been generally positive.  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  She has been giving presentations and [facility]'ing different events  Pain Inventory Average Pain 4 Pain Right Now 7 My pain is constant, sharp, dull, stabbing and aching  In the last 24 hours, has pain interfered with the following? General activity 5 Relation with others 4 Enjoyment of life 6 What TIME of day is your pain at its worst? all Sleep (in general) Good  Pain is worse with: na Pain improves with: rest and medication Relief from Meds: 9  Mobility walk without assistance ability to climb steps?  yes do you drive?  yes  Function Do you have any goals in this area?  no  Neuro/Psych tremor depression  Prior Studies Any changes since last visit?  no  Physicians involved in your care Any changes since last visit?  no   Family History  Problem Relation Age of Onset  . Multiple sclerosis Mother   . Diabetes Father   . Heart disease Father   . Hypertension Father   . Stroke Father    Social History   Socioeconomic History  .  Marital status: Married    Spouse name: Not on file  . Number of children: Not on file  . Years of education: Not on file  . Highest education level: Not on file  Occupational History  . Not on file  Social Needs  . Financial resource strain: Not on file  . Food insecurity:    Worry: Not on file    Inability: Not on file  . Transportation needs:    Medical: Not on file    Non-medical: Not on file  Tobacco Use  . Smoking status: Never Smoker  . Smokeless tobacco: Never Used  Substance and Sexual Activity  . Alcohol use: Yes    Alcohol/week: 0.0 oz    Comment: occasional  . Drug use: No  . Sexual activity: Not on file  Lifestyle  . Physical activity:    Days per week: Not on file    Minutes per session: Not on file  . Stress: Not on file  Relationships  . Social connections:    Talks on phone: Not on file    Gets together: Not on file    Attends religious service: Not on file    Active member of club or organization: Not on file    Attends meetings of clubs or organizations: Not on file  Relationship status: Not on file  Other Topics Concern  . Not on file  Social History Narrative  . Not on file   Past Surgical History:  Procedure Laterality Date  . ABDOMINAL HYSTERECTOMY    . abdominal plasty  03/2015  . APPENDECTOMY    . CESAREAN SECTION     Past Medical History:  Diagnosis Date  . Liver lesion   . Migraines   . Sigmoid diverticulum    BP 113/69   Pulse 82   Ht 5\' 1"  (1.549 m) Comment: states  Wt 148 lb (67.1 kg)   SpO2 97%   BMI 27.96 kg/m   Opioid Risk Score:   Fall Risk Score:  `1  Depression screen PHQ 2/9  No flowsheet data found.   Review of Systems  Constitutional: Negative.   HENT: Negative.   Eyes: Negative.   Respiratory: Negative.   Cardiovascular: Negative.   Gastrointestinal: Negative.   Endocrine: Negative.   Genitourinary: Negative.   Musculoskeletal: Negative.   Skin: Negative.   Allergic/Immunologic: Negative.     Neurological: Positive for tremors and headaches.  Hematological: Negative.   Psychiatric/Behavioral: Positive for dysphoric mood.  All other systems reviewed and are negative.      Objective:   Physical Exam  General: No acute distress HEENT: EOMI, oral membranes moist Cards: reg rate  Chest: normal effort Abdomen: Soft, NT, ND Skin: dry, intact Extremities: no edema Skin:Clean and intact without signs of breakdown Neuro:Patient displays improved attention and processing.  Cranial nerve exam is nonfocal.  Sensory exam is normal. Reflexes are 2+ in all 4's.Gaze is conjugate. I did not perform vestibular testing today. tremors are still present butm decreased in amplitude.  Tor exam is 5 out of 5 bilaterally.  Musculoskeletal: good posture, ROM Psych:Smiling and in good spirits today.     Assessment & Plan:  1. Postconcussion syndrome with persistent higher level cognitive deficits, vertigo, sleep disorder, balance deficits, and headaches.  2. Reactive depression/PTSD 3. History of migraines.      1. Given that her sleep has improved we can change trazodone to50mg  qhs prn to reduce am fatigue and with increase in topiramate 2.Will increaeTrokendi XRback to 150 mg nightlyfor headaches.think the decrease has exacerbated headaches.  3.Continue with [facility] PT for vestibular treatment--she is seeing benefit 4.Continue with[facility] SLP for higher level cognitive assessment and remediation.  5.Ongoing follow up withDr. Arley Phenix for neuro-psych assessment and treatment of PTSD/reactive depression symptoms/coping skillst/etc.  6.Continue Lexapro20 mg at bedtime for mood/sleep.       -maintain low-dose Xanax 0.25 for breakthrough anxiety only. 7.Maintain sinemet for tremors. To 25/100 tid.   -consider clonazepam trial as a back up although sinemet has helped.  -could resume primidone  8.AdjustRitalin to10  mg daily at 7 AM and 12 noon and half tab before dinner to provide better coverage during the latter parts of the day.  She does not have to use on days off for on the weekend..#75. Second rx provided. We will continue the controlled substance monitoring program, this consists of regular clinic visits, examinations, routine drug screening, pill counts as well as use of [place] Controlled Substance Reporting System. [place].  -UDS today 9.Continuef Imitrex 100 mg as neededfor breakthrough migraine.?botox candidate perhaps at some point.   spent with this patient during examination and review of treatment plan. All questions were answered. I will follow up with her in about 2 months time.  All questions were encouraged  and answered

## 2017-08-04 ENCOUNTER — Ambulatory Visit: Payer: PRIVATE HEALTH INSURANCE | Admitting: Physical Therapy

## 2017-08-04 LAB — TOXASSURE SELECT,+ANTIDEPR,UR

## 2017-08-05 ENCOUNTER — Telehealth: Payer: Self-pay | Admitting: *Deleted

## 2017-08-05 NOTE — Telephone Encounter (Signed)
Urine drug screen for this encounter is consistent for prescribed medication 

## 2017-08-18 ENCOUNTER — Encounter (HOSPITAL_BASED_OUTPATIENT_CLINIC_OR_DEPARTMENT_OTHER): Payer: PRIVATE HEALTH INSURANCE | Admitting: Psychology

## 2017-08-18 DIAGNOSIS — F329 Major depressive disorder, single episode, unspecified: Secondary | ICD-10-CM | POA: Diagnosis not present

## 2017-08-18 DIAGNOSIS — F0781 Postconcussional syndrome: Secondary | ICD-10-CM | POA: Diagnosis not present

## 2017-08-18 DIAGNOSIS — F4310 Post-traumatic stress disorder, unspecified: Secondary | ICD-10-CM

## 2017-08-25 ENCOUNTER — Ambulatory Visit: Payer: PRIVATE HEALTH INSURANCE | Admitting: Physical Therapy

## 2017-08-25 DIAGNOSIS — R2681 Unsteadiness on feet: Secondary | ICD-10-CM

## 2017-08-25 DIAGNOSIS — R42 Dizziness and giddiness: Secondary | ICD-10-CM

## 2017-08-26 ENCOUNTER — Encounter: Payer: Self-pay | Admitting: Psychology

## 2017-08-26 ENCOUNTER — Encounter: Payer: Self-pay | Admitting: Physical Therapy
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
## 2017-09-01 ENCOUNTER — Ambulatory Visit: Payer: PRIVATE HEALTH INSURANCE | Admitting: Physical Therapy

## 2017-09-08 ENCOUNTER — Encounter: Payer: Self-pay | Admitting: Physical Therapy

## 2017-09-08 ENCOUNTER — Ambulatory Visit: Payer: PRIVATE HEALTH INSURANCE | Attending: Physical Medicine & Rehabilitation | Admitting: Physical Therapy

## 2017-09-08 DIAGNOSIS — R2681 Unsteadiness on feet: Secondary | ICD-10-CM

## 2017-09-08 DIAGNOSIS — R42 Dizziness and giddiness: Secondary | ICD-10-CM | POA: Insufficient documentation

## 2017-09-09 ENCOUNTER — Encounter: Payer: Self-pay | Admitting: Physical Therapy
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
## 2017-09-15 ENCOUNTER — Ambulatory Visit: Payer: PRIVATE HEALTH INSURANCE | Admitting: Physical Therapy

## 2017-09-15 ENCOUNTER — Encounter: Payer: Self-pay | Admitting: Physical Therapy

## 2017-09-15 DIAGNOSIS — R2681 Unsteadiness on feet: Secondary | ICD-10-CM

## 2017-09-15 DIAGNOSIS — R42 Dizziness and giddiness: Secondary | ICD-10-CM

## 2017-09-16 ENCOUNTER — Encounter: Payer: Self-pay | Admitting: Physical Therapy
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
## 2017-09-25 ENCOUNTER — Encounter: Payer: Self-pay | Admitting: Physical Therapy

## 2017-09-25 ENCOUNTER — Ambulatory Visit: Payer: PRIVATE HEALTH INSURANCE | Admitting: Physical Therapy

## 2017-09-25 DIAGNOSIS — R42 Dizziness and giddiness: Secondary | ICD-10-CM

## 2017-09-25 DIAGNOSIS — R2681 Unsteadiness on feet: Secondary | ICD-10-CM
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
## 2017-10-06 ENCOUNTER — Ambulatory Visit: Payer: PRIVATE HEALTH INSURANCE | Admitting: Physical Therapy

## 2017-10-06 ENCOUNTER — Encounter: Payer: PRIVATE HEALTH INSURANCE | Admitting: Physical Medicine & Rehabilitation

## 2017-10-14 ENCOUNTER — Other Ambulatory Visit: Payer: Self-pay | Admitting: Physical Medicine & Rehabilitation

## 2017-10-14 DIAGNOSIS — G43709 Chronic migraine without aura, not intractable, without status migrainosus: Secondary | ICD-10-CM

## 2017-10-14 DIAGNOSIS — F4310 Post-traumatic stress disorder, unspecified: Secondary | ICD-10-CM

## 2017-10-14 DIAGNOSIS — IMO0002 Reserved for concepts with insufficient information to code with codable children: Secondary | ICD-10-CM

## 2017-10-14 MED ORDER — TOPIRAMATE ER 50 MG PO CAP24
100.0000 mg | ORAL_CAPSULE | Freq: Every day | ORAL | 3 refills | Status: DC
Start: 2017-10-14 — End: 2018-05-06

## 2017-10-17 ENCOUNTER — Ambulatory Visit: Payer: Self-pay | Admitting: Psychology

## 2017-10-27 ENCOUNTER — Ambulatory Visit: Payer: PRIVATE HEALTH INSURANCE | Attending: Physical Medicine & Rehabilitation | Admitting: Physical Therapy

## 2017-10-27 DIAGNOSIS — R2681 Unsteadiness on feet: Secondary | ICD-10-CM | POA: Insufficient documentation

## 2017-10-27 DIAGNOSIS — R42 Dizziness and giddiness: Secondary | ICD-10-CM

## 2017-10-28 ENCOUNTER — Encounter: Payer: Self-pay | Admitting: Physical Therapy
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
## 2017-11-03 ENCOUNTER — Encounter: Payer: Self-pay | Admitting: Physical Therapy

## 2017-11-03 ENCOUNTER — Ambulatory Visit: Payer: PRIVATE HEALTH INSURANCE | Admitting: Physical Therapy

## 2017-11-03 DIAGNOSIS — R42 Dizziness and giddiness: Secondary | ICD-10-CM
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
## 2017-11-05 ENCOUNTER — Encounter
Payer: PRIVATE HEALTH INSURANCE | Attending: Physical Medicine & Rehabilitation | Admitting: Physical Medicine & Rehabilitation

## 2017-11-05 ENCOUNTER — Telehealth: Payer: Self-pay

## 2017-11-05 ENCOUNTER — Encounter: Payer: Self-pay | Admitting: Physical Medicine & Rehabilitation

## 2017-11-05 VITALS — BP 112/74 | HR 70 | Resp 14 | Ht 61.0 in | Wt 153.0 lb

## 2017-11-05 DIAGNOSIS — F0781 Postconcussional syndrome: Secondary | ICD-10-CM | POA: Diagnosis not present

## 2017-11-05 DIAGNOSIS — F329 Major depressive disorder, single episode, unspecified: Secondary | ICD-10-CM | POA: Diagnosis not present

## 2017-11-05 DIAGNOSIS — G43909 Migraine, unspecified, not intractable, without status migrainosus: Secondary | ICD-10-CM | POA: Insufficient documentation

## 2017-11-05 DIAGNOSIS — F4310 Post-traumatic stress disorder, unspecified: Secondary | ICD-10-CM | POA: Diagnosis not present

## 2017-11-05 DIAGNOSIS — H8110 Benign paroxysmal vertigo, unspecified ear: Secondary | ICD-10-CM | POA: Insufficient documentation

## 2017-11-05 DIAGNOSIS — G43709 Chronic migraine without aura, not intractable, without status migrainosus: Secondary | ICD-10-CM

## 2017-11-05 DIAGNOSIS — IMO0002 Reserved for concepts with insufficient information to code with codable children: Secondary | ICD-10-CM

## 2017-11-05 MED ORDER — SUMATRIPTAN SUCCINATE 100 MG PO TABS: 100.0000 mg | ORAL_TABLET | ORAL | 5 refills | Status: DC | PRN

## 2017-11-05 MED ORDER — METHYLPHENIDATE HCL 10 MG PO TABS: 5.0000 mg | ORAL_TABLET | Freq: Three times a day (TID) | ORAL | 0 refills | Status: DC

## 2017-11-05 MED ORDER — ESCITALOPRAM OXALATE 10 MG PO TABS: 20.0000 mg | ORAL_TABLET | Freq: Every day | ORAL | 6 refills | Status: DC

## 2017-11-05 MED ORDER — METHYLPHENIDATE HCL 10 MG PO TABS
5.0000 mg | ORAL_TABLET | Freq: Three times a day (TID) | ORAL | 0 refills | Status: DC
Start: 2017-11-05 — End: 2017-11-05

## 2017-11-05 NOTE — Telephone Encounter (Signed)
Recieved faxed requst for a prior auth for this patients sumatriptan 100mg .  Prior auth started today 11-05-17

## 2017-11-05 NOTE — Patient Instructions (Signed)
PLEASE FEEL FREE TO CALL OUR OFFICE WITH ANY PROBLEMS OR QUESTIONS [phone])     CONTINUE ACTIVITY AS TOLERATED
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
## 2017-11-12 ENCOUNTER — Telehealth: Payer: Self-pay

## 2017-11-12 DIAGNOSIS — G43709 Chronic migraine without aura, not intractable, without status migrainosus: Secondary | ICD-10-CM

## 2017-11-12 DIAGNOSIS — IMO0002 Reserved for concepts with insufficient information to code with codable children: Secondary | ICD-10-CM

## 2017-11-12 MED ORDER — SUMATRIPTAN SUCCINATE 100 MG PO TABS: 100.0000 mg | ORAL_TABLET | ORAL | 5 refills | Status: DC | PRN

## 2017-11-12 NOTE — Telephone Encounter (Signed)
Submitted a prior auth for sumatriptan on 11-05-17, it came back today as being denied for the following reasons:  Sumatriptan 100mg  tablet (use as directed: 10 tablets per 5 days) is denied for medical necessity. Your plan allows you to receive 9 tablets per month. Medication authorization for the requested drug exceeding this monthly limit requires the following: (1) You have one of the following diagnoses: (a) Acute migraines with or without aura; OR (b) cluster headaches; AND (2) the requested drug is prescribed by or recommended by a neurologist or a pain management specialist; AND (3) you are experiencing 2 or more headaches per month; AND (4) you will not be treating 15 or more headaches per month; AND (5) the requested drug will not be used in combination with another triptan or ergotamine-containing product; AND (6) one of the following: (a) The higher dose or quantity is supported in the dosage and administration section of the manufacturer's prescribing information; OR (b) the higher dose or quantity is supported by one of the following compendia: (i) [facility] Formulary Service (AHFS) Drug Information; OR (ii) DRUGDEX System by [employer]. Reviewed by: Ellie LunchKAC, Pharm.D.  The original order that was sent to the pharmacy was for 10 with x refills, should we reorder the script for 9 tabs with x refills and then try the prior auth appeal?  Please advise.

## 2017-11-12 NOTE — Telephone Encounter (Signed)
Called pharmacy and told them of dosage and quantity change with the prescription of Imitrex, they stated that they already did the change on the 11-06-17 for 9 tabs for the month and the prescription was processed with NO PRIOR AUTH NESSARY and the patient has already picked up the medication.

## 2017-11-12 NOTE — Telephone Encounter (Signed)
Spoke with Dr. Riley KillSwartz on the medication and prior auth issues, he agreed to change the prescription from 10 tabs for 30 days with 5 refills to 9 tabs for 30 days with 5 refills.  Will call pharmacy with prescription change.  Will then call, if needed, to [employer] for prior auth appeal with CORRECT information.

## 2017-11-13 ENCOUNTER — Ambulatory Visit: Payer: PRIVATE HEALTH INSURANCE | Admitting: Physical Therapy

## 2017-11-13 DIAGNOSIS — R2681 Unsteadiness on feet: Secondary | ICD-10-CM

## 2017-11-13 DIAGNOSIS — R42 Dizziness and giddiness: Secondary | ICD-10-CM

## 2017-11-14 ENCOUNTER — Encounter: Payer: Self-pay | Admitting: Physical Therapy
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
## 2017-11-20 ENCOUNTER — Ambulatory Visit: Payer: PRIVATE HEALTH INSURANCE | Admitting: Physical Therapy

## 2017-11-20 DIAGNOSIS — R42 Dizziness and giddiness: Secondary | ICD-10-CM

## 2017-11-20 DIAGNOSIS — R2681 Unsteadiness on feet: Secondary | ICD-10-CM

## 2017-11-21 ENCOUNTER — Encounter: Payer: Self-pay | Admitting: Physical Therapy
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
## 2017-11-30 ENCOUNTER — Other Ambulatory Visit: Payer: Self-pay | Admitting: Physical Medicine & Rehabilitation

## 2017-11-30 DIAGNOSIS — IMO0002 Reserved for concepts with insufficient information to code with codable children: Secondary | ICD-10-CM

## 2017-11-30 DIAGNOSIS — G43709 Chronic migraine without aura, not intractable, without status migrainosus: Secondary | ICD-10-CM

## 2017-12-08 ENCOUNTER — Ambulatory Visit: Payer: PRIVATE HEALTH INSURANCE | Attending: Physical Medicine & Rehabilitation | Admitting: Physical Therapy

## 2017-12-08 ENCOUNTER — Encounter: Payer: Self-pay | Admitting: Physical Therapy

## 2017-12-08 DIAGNOSIS — R42 Dizziness and giddiness: Secondary | ICD-10-CM | POA: Diagnosis present

## 2017-12-08 DIAGNOSIS — R2681 Unsteadiness on feet: Secondary | ICD-10-CM | POA: Insufficient documentation
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
## 2017-12-12 ENCOUNTER — Encounter: Payer: PRIVATE HEALTH INSURANCE | Attending: Physical Medicine & Rehabilitation | Admitting: Psychology

## 2017-12-12 DIAGNOSIS — F0781 Postconcussional syndrome: Secondary | ICD-10-CM | POA: Diagnosis not present

## 2017-12-12 DIAGNOSIS — G43709 Chronic migraine without aura, not intractable, without status migrainosus: Secondary | ICD-10-CM

## 2017-12-12 DIAGNOSIS — F4310 Post-traumatic stress disorder, unspecified: Secondary | ICD-10-CM | POA: Diagnosis not present

## 2017-12-12 DIAGNOSIS — F329 Major depressive disorder, single episode, unspecified: Secondary | ICD-10-CM | POA: Diagnosis not present

## 2017-12-12 DIAGNOSIS — H8110 Benign paroxysmal vertigo, unspecified ear: Secondary | ICD-10-CM | POA: Diagnosis not present

## 2017-12-12 DIAGNOSIS — G43909 Migraine, unspecified, not intractable, without status migrainosus: Secondary | ICD-10-CM | POA: Diagnosis not present

## 2017-12-12 DIAGNOSIS — IMO0002 Reserved for concepts with insufficient information to code with codable children: Secondary | ICD-10-CM

## 2017-12-15 ENCOUNTER — Ambulatory Visit: Payer: PRIVATE HEALTH INSURANCE | Admitting: Physical Therapy

## 2017-12-15 DIAGNOSIS — R2681 Unsteadiness on feet: Secondary | ICD-10-CM | POA: Diagnosis not present

## 2017-12-15 DIAGNOSIS — R42 Dizziness and giddiness: Secondary | ICD-10-CM

## 2017-12-16 ENCOUNTER — Encounter: Payer: Self-pay | Admitting: Physical Therapy
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
## 2017-12-22 ENCOUNTER — Ambulatory Visit: Payer: PRIVATE HEALTH INSURANCE | Admitting: Physical Therapy

## 2017-12-22 ENCOUNTER — Telehealth: Payer: Self-pay | Admitting: Physical Therapy

## 2017-12-22 DIAGNOSIS — R2681 Unsteadiness on feet: Secondary | ICD-10-CM

## 2017-12-22 DIAGNOSIS — R42 Dizziness and giddiness: Secondary | ICD-10-CM
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
## 2017-12-23 ENCOUNTER — Encounter: Payer: Self-pay | Admitting: Physical Therapy
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
## 2017-12-30 ENCOUNTER — Ambulatory Visit: Payer: PRIVATE HEALTH INSURANCE | Attending: Physical Medicine & Rehabilitation | Admitting: Physical Therapy

## 2017-12-30 DIAGNOSIS — R42 Dizziness and giddiness: Secondary | ICD-10-CM | POA: Diagnosis present

## 2017-12-30 DIAGNOSIS — R2681 Unsteadiness on feet: Secondary | ICD-10-CM | POA: Diagnosis present

## 2017-12-31 ENCOUNTER — Encounter: Payer: Self-pay | Admitting: Physical Therapy
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
## 2018-01-05 ENCOUNTER — Ambulatory Visit: Payer: PRIVATE HEALTH INSURANCE | Admitting: Physical Therapy

## 2018-01-05 DIAGNOSIS — R42 Dizziness and giddiness: Secondary | ICD-10-CM

## 2018-01-05 DIAGNOSIS — R2681 Unsteadiness on feet: Secondary | ICD-10-CM

## 2018-01-06 ENCOUNTER — Encounter: Payer: Self-pay | Admitting: Physical Therapy
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
## 2018-01-08 ENCOUNTER — Encounter: Payer: Self-pay | Admitting: Psychology
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
## 2018-01-12 ENCOUNTER — Ambulatory Visit: Payer: PRIVATE HEALTH INSURANCE | Admitting: Physical Therapy

## 2018-01-12 DIAGNOSIS — R2681 Unsteadiness on feet: Secondary | ICD-10-CM

## 2018-01-12 DIAGNOSIS — R42 Dizziness and giddiness: Secondary | ICD-10-CM

## 2018-01-13 ENCOUNTER — Encounter: Payer: Self-pay | Admitting: Physical Therapy
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
## 2018-01-19 ENCOUNTER — Ambulatory Visit: Payer: PRIVATE HEALTH INSURANCE | Admitting: Physical Therapy

## 2018-02-02 ENCOUNTER — Ambulatory Visit: Payer: PRIVATE HEALTH INSURANCE | Attending: Physical Medicine & Rehabilitation | Admitting: Physical Therapy

## 2018-02-04 ENCOUNTER — Encounter
Payer: PRIVATE HEALTH INSURANCE | Attending: Physical Medicine & Rehabilitation | Admitting: Physical Medicine & Rehabilitation

## 2018-02-04 ENCOUNTER — Encounter: Payer: Self-pay | Admitting: Physical Medicine & Rehabilitation

## 2018-02-04 VITALS — BP 110/73 | HR 82 | Resp 14 | Ht 61.0 in | Wt 171.0 lb

## 2018-02-04 DIAGNOSIS — F0781 Postconcussional syndrome: Secondary | ICD-10-CM

## 2018-02-04 DIAGNOSIS — H8110 Benign paroxysmal vertigo, unspecified ear: Secondary | ICD-10-CM | POA: Diagnosis not present

## 2018-02-04 DIAGNOSIS — G43709 Chronic migraine without aura, not intractable, without status migrainosus: Secondary | ICD-10-CM | POA: Diagnosis not present

## 2018-02-04 DIAGNOSIS — F329 Major depressive disorder, single episode, unspecified: Secondary | ICD-10-CM | POA: Insufficient documentation

## 2018-02-04 DIAGNOSIS — G43909 Migraine, unspecified, not intractable, without status migrainosus: Secondary | ICD-10-CM | POA: Diagnosis not present

## 2018-02-04 DIAGNOSIS — F4310 Post-traumatic stress disorder, unspecified: Secondary | ICD-10-CM | POA: Diagnosis not present

## 2018-02-04 DIAGNOSIS — IMO0002 Reserved for concepts with insufficient information to code with codable children: Secondary | ICD-10-CM

## 2018-02-04 MED ORDER — ERENUMAB-AOOE 70 MG/ML ~~LOC~~ SOAJ: 70.0000 mg | SUBCUTANEOUS | 3 refills | Status: DC

## 2018-02-04 MED ORDER — METHYLPHENIDATE HCL 10 MG PO TABS: 5.0000 mg | ORAL_TABLET | Freq: Three times a day (TID) | ORAL | 0 refills | Status: DC

## 2018-02-04 NOTE — Patient Instructions (Signed)
PLEASE FEEL FREE TO CALL OUR OFFICE WITH ANY PROBLEMS OR QUESTIONS ([phone])      
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
## 2018-03-03 ENCOUNTER — Other Ambulatory Visit: Payer: Self-pay | Admitting: Physical Medicine & Rehabilitation

## 2018-05-06 ENCOUNTER — Encounter
Payer: PRIVATE HEALTH INSURANCE | Attending: Physical Medicine & Rehabilitation | Admitting: Physical Medicine & Rehabilitation

## 2018-05-06 ENCOUNTER — Encounter: Payer: Self-pay | Admitting: Physical Medicine & Rehabilitation

## 2018-05-06 VITALS — BP 104/72 | HR 77 | Ht 62.0 in | Wt 179.0 lb

## 2018-05-06 DIAGNOSIS — F0781 Postconcussional syndrome: Secondary | ICD-10-CM | POA: Diagnosis present

## 2018-05-06 DIAGNOSIS — G43709 Chronic migraine without aura, not intractable, without status migrainosus: Secondary | ICD-10-CM

## 2018-05-06 DIAGNOSIS — F4310 Post-traumatic stress disorder, unspecified: Secondary | ICD-10-CM

## 2018-05-06 DIAGNOSIS — F329 Major depressive disorder, single episode, unspecified: Secondary | ICD-10-CM | POA: Insufficient documentation

## 2018-05-06 DIAGNOSIS — IMO0002 Reserved for concepts with insufficient information to code with codable children: Secondary | ICD-10-CM

## 2018-05-06 DIAGNOSIS — G43909 Migraine, unspecified, not intractable, without status migrainosus: Secondary | ICD-10-CM | POA: Insufficient documentation

## 2018-05-06 DIAGNOSIS — H8110 Benign paroxysmal vertigo, unspecified ear: Secondary | ICD-10-CM | POA: Insufficient documentation

## 2018-05-06 MED ORDER — METHYLPHENIDATE HCL 10 MG PO TABS: 5.0000 mg | ORAL_TABLET | Freq: Three times a day (TID) | ORAL | 0 refills | Status: DC

## 2018-05-06 MED ORDER — ESCITALOPRAM OXALATE 10 MG PO TABS: 20.0000 mg | ORAL_TABLET | Freq: Every day | ORAL | 6 refills | Status: DC

## 2018-05-06 MED ORDER — ERENUMAB-AOOE 70 MG/ML ~~LOC~~ SOAJ: 70.0000 mg | SUBCUTANEOUS | 6 refills | Status: DC

## 2018-05-06 NOTE — Patient Instructions (Signed)
PLEASE FEEL FREE TO CALL OUR OFFICE WITH ANY PROBLEMS OR QUESTIONS ([phone])      

## 2018-05-06 NOTE — Progress Notes (Signed)
Subjective:    Patient ID: Robin Stout, female    DOB: [birth year], 43 y.o.   MRN: [id number]  HPI   Hope is here in follow-up of her postconcussion syndrome.  She is continued to make nice progress. She has done extremely well with aimovig. She is down to approximately 2 headaches per month.  She was able to come off of her topiramate and trazodone as result.  Quality of life has improved as well.  She continues to work full-time for [employer].  Her divorce is almost final which will be a big load off of her shoulders.  It is adding some stress though in the meantime.  She has found a new boyfriend who has provided some joy to her life as well.  He does report going through a couple bouts of depressed feelings last fall and before the holidays because, as she put it, "there were a lot of things going on."  Otherwise her mood has been more upbeat.  She remains on Lexapro 20 mg daily.  She takes Ritalin 5 to 10 mg 3 times daily.  Sleep has been hit or miss at times specially with some of the stress of late. Pain Inventory Average Pain 2 Pain Right Now 2 My pain is dull  In the last 24 hours, has pain interfered with the following? General activity 2 Relation with others 0 Enjoyment of life 2 What TIME of day is your pain at its worst? all Sleep (in general) Fair  Pain is worse with: some activites Pain improves with: rest and medication Relief from Meds: 6  Mobility Do you have any goals in this area?  no  Function Do you have any goals in this area?  no  Neuro/Psych tremor depression anxiety  Prior Studies Any changes since last visit?  no  Physicians involved in your care Any changes since last visit?  no   Family History  Problem Relation Age of Onset  . Multiple sclerosis Mother   . Diabetes Father   . Heart disease Father   . Hypertension Father   . Stroke Father    Social History   Socioeconomic History  . Marital status: Married    Spouse  name: Not on file  . Number of children: Not on file  . Years of education: Not on file  . Highest education level: Not on file  Occupational History  . Not on file  Social Needs  . Financial resource strain: Not on file  . Food insecurity:    Worry: Not on file    Inability: Not on file  . Transportation needs:    Medical: Not on file    Non-medical: Not on file  Tobacco Use  . Smoking status: Never Smoker  . Smokeless tobacco: Never Used  Substance and Sexual Activity  . Alcohol use: Yes    Alcohol/week: 0.0 standard drinks    Comment: occasional  . Drug use: No  . Sexual activity: Not on file  Lifestyle  . Physical activity:    Days per week: Not on file    Minutes per session: Not on file  . Stress: Not on file  Relationships  . Social connections:    Talks on phone: Not on file    Gets together: Not on file    Attends religious service: Not on file    Active member of club or organization: Not on file    Attends meetings of clubs or organizations: Not  on file    Relationship status: Not on file  Other Topics Concern  . Not on file  Social History Narrative  . Not on file   Past Surgical History:  Procedure Laterality Date  . ABDOMINAL HYSTERECTOMY    . abdominal plasty  03/2015  . APPENDECTOMY    . CESAREAN SECTION     Past Medical History:  Diagnosis Date  . Liver lesion   . Migraines   . Sigmoid diverticulum    BP 104/72   Pulse 77   Ht 5\' 2"  (1.575 m)   Wt 179 lb (81.2 kg)   SpO2 97%   BMI 32.74 kg/m   Opioid Risk Score:   Fall Risk Score:  `1  Depression screen PHQ 2/9  No flowsheet data found.   Review of Systems  Constitutional: Positive for unexpected weight change.  HENT: Negative.   Eyes: Negative.   Respiratory: Negative.   Cardiovascular: Negative.   Gastrointestinal: Negative.   Endocrine: Negative.   Genitourinary: Negative.   Musculoskeletal: Negative.   Skin: Negative.   Allergic/Immunologic: Negative.     Neurological: Positive for tremors and headaches.  Hematological: Negative.   Psychiatric/Behavioral: Positive for dysphoric mood. The patient is nervous/anxious.   All other systems reviewed and are negative.      Objective:   Physical Exam  General: No acute distress HEENT: EOMI, oral membranes moist Cards: reg rate  Chest: normal effort Abdomen: Soft, NT, ND Skin: dry, intact Extremities: no edema Neuro:Patient displays normal insight, awareness, and functional memory.  S.Cranial nerve exam is nonfocal. Sensory exam is normal. Reflexes are 2+ in all 4's.Gaze conjugate.  Tracking improved.  No tremor.  Strength is 5 out of 5. Musculoskeletal: good posture, ROM Psych: Remains very pleasant.     Assessment & Plan:  1. Postconcussion syndrome with persistent higher level cognitive deficits, vertigo, sleep disorder, balance deficits, and headaches.  2. Reactive depression/PTSD 3. History of migraines. 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 Discussed the implementation of some meditation or mindfulness techniques to help deal with stress and feelings of increased depression.  I would hope that these type of feeling should further diminish as her divorce finalizes and we move into the spring. 2.Off topamax  3.Continue with home exercise program as discussed 4.Consider resuming trazodone as needed for sleep if sleep remains an ongoing problem 5.Ongoing follow up withDr. Arley PhenixJohn Rodenbough for neuro-psych assessment and treatment of PTSD/reactive depression symptoms/coping skillst/etc.  6.Continue Lexapro20 mg at bedtime for mood/sleep.  Refilled Lexapro today y. 7. Patient has had improvement in tremors with her improved cognitive behavioral status.  Headache management also has improved these.  Will not resume Sinemet  8.ContinueRitalin to10 mg daily at 7 AM and 12 noon and half tab before dinnerto provide better coverage during the latter parts of  the day. She does not have to use on days off for on the weekend..#75.     -We will continue the controlled substance monitoring program, this consists of regular clinic visits, examinations, routine drug screening, pill counts as well as use of [place] Cont[place]. [place] today.       -Medication was refilled and a second prescription was sent to the patient's pharmacy for next month.   9.Continuef Imitrex 100 mg as neededfor breakthrough migraine.   10. Continue aimovig 70mg  sq monthly for migraine prophylaxis. RF provided   15 minuteswas spent with this patient during examination and review of treatment plan. All  questions were answered. I will follow up with her in about24monthstime. All questions were encouraged and answered         Assessment & Plan:

## 2018-07-20 ENCOUNTER — Other Ambulatory Visit: Payer: Self-pay

## 2018-07-20 ENCOUNTER — Encounter: Payer: PRIVATE HEALTH INSURANCE | Attending: Physical Medicine & Rehabilitation | Admitting: Registered Nurse

## 2018-07-20 DIAGNOSIS — F4310 Post-traumatic stress disorder, unspecified: Secondary | ICD-10-CM

## 2018-07-20 DIAGNOSIS — G43709 Chronic migraine without aura, not intractable, without status migrainosus: Secondary | ICD-10-CM

## 2018-07-20 DIAGNOSIS — F329 Major depressive disorder, single episode, unspecified: Secondary | ICD-10-CM | POA: Diagnosis not present

## 2018-07-20 DIAGNOSIS — G43909 Migraine, unspecified, not intractable, without status migrainosus: Secondary | ICD-10-CM | POA: Diagnosis not present

## 2018-07-20 DIAGNOSIS — H8110 Benign paroxysmal vertigo, unspecified ear: Secondary | ICD-10-CM | POA: Insufficient documentation

## 2018-07-20 DIAGNOSIS — F0781 Postconcussional syndrome: Secondary | ICD-10-CM | POA: Diagnosis not present

## 2018-07-20 DIAGNOSIS — IMO0002 Reserved for concepts with insufficient information to code with codable children: Secondary | ICD-10-CM

## 2018-07-20 NOTE — Progress Notes (Signed)
This Provider placed a call to [name], Robin Stout, she was scheduled for office follow up appointment, this appointment was changed to a virtual office visit. This visit will reduce the risk of exposure to the virus and to help her remain healthy and safe. The virtual visit will also provide continuity of care.  Robin Stout reports today she has a mild headache, she is doing extremely well with the Aimovig and reports she is down to approximately  4 headaches per month, which is a great improvement she states.  She continues to work full-time for [employer], also states her divorce is final. She wants her prescriptions sent under Robin Stout, she was instructed to fax in driver license, then the office staff can change her name, she verbalizes understanding.  Assessment and Plan:  1. Postconcussion syndrome with persistent higher level cognitive deficits and headaches.  Refilled: Ritalin 10 mg at 7 an and 12 noon and half tablet before dinner to provide better coverage during the latter part of the day. She's not using on her days off or weekends #75. Second script sent for the following month.  We will continue the opioid monitoring program, this consists of regular clinic visits, examinations, urine drug screen, pill counts as well as use of [place] Controlled Substance Reporting system. 2. PTSD: Continue with Follow up with Dr. Kieth Stout.  3.Reactive Depression:  Continue Lexapro. Continue to Monitor.  4. Chronic Migraine: Continue Aimovig and Continue Imitrex as needed for breakthrough migraine.

## 2018-07-21 ENCOUNTER — Telehealth: Payer: Self-pay | Admitting: Registered Nurse

## 2018-07-21 ENCOUNTER — Encounter: Payer: PRIVATE HEALTH INSURANCE | Admitting: Physical Medicine & Rehabilitation

## 2018-07-21 DIAGNOSIS — F0781 Postconcussional syndrome: Secondary | ICD-10-CM

## 2018-07-21 MED ORDER — METHYLPHENIDATE HCL 10 MG PO TABS: 5.0000 mg | ORAL_TABLET | Freq: Three times a day (TID) | ORAL | 0 refills | Status: DC

## 2018-07-21 NOTE — Telephone Encounter (Signed)
Ritalin e-scribed for March and April 2020. [name] is aware of the above, spoke with her on 07/20/2018. Due to her name change we we're awaiting documentation.

## 2018-07-27 ENCOUNTER — Ambulatory Visit: Payer: Self-pay | Admitting: Physical Medicine & Rehabilitation

## 2018-08-11 ENCOUNTER — Other Ambulatory Visit: Payer: Self-pay | Admitting: Physical Medicine & Rehabilitation

## 2018-08-11 DIAGNOSIS — IMO0002 Reserved for concepts with insufficient information to code with codable children: Secondary | ICD-10-CM

## 2018-08-11 DIAGNOSIS — G43709 Chronic migraine without aura, not intractable, without status migrainosus: Secondary | ICD-10-CM

## 2018-09-07 ENCOUNTER — Ambulatory Visit: Payer: Self-pay | Admitting: Physical Medicine & Rehabilitation

## 2018-11-11 ENCOUNTER — Encounter
Payer: PRIVATE HEALTH INSURANCE | Attending: Physical Medicine & Rehabilitation | Admitting: Physical Medicine & Rehabilitation

## 2018-11-26 ENCOUNTER — Other Ambulatory Visit: Payer: Self-pay

## 2018-11-26 DIAGNOSIS — Z20822 Contact with and (suspected) exposure to covid-19: Secondary | ICD-10-CM

## 2018-11-28 LAB — NOVEL CORONAVIRUS, NAA: SARS-CoV-2, NAA: NOT DETECTED

## 2018-12-08 ENCOUNTER — Other Ambulatory Visit: Payer: Self-pay | Admitting: Physical Medicine & Rehabilitation

## 2018-12-08 DIAGNOSIS — G43709 Chronic migraine without aura, not intractable, without status migrainosus: Secondary | ICD-10-CM

## 2018-12-08 DIAGNOSIS — IMO0002 Reserved for concepts with insufficient information to code with codable children: Secondary | ICD-10-CM

## 2018-12-21 ENCOUNTER — Telehealth: Payer: Self-pay

## 2018-12-21 NOTE — Telephone Encounter (Signed)
Recieved faxed request for a prior auth for AIMOVIG injections.  Started on 12-14-2018.   Received approval on 12-14-2018 of it being approved

## 2019-02-09 ENCOUNTER — Other Ambulatory Visit: Payer: Self-pay

## 2019-02-09 DIAGNOSIS — Z20822 Contact with and (suspected) exposure to covid-19: Secondary | ICD-10-CM

## 2019-02-11 LAB — NOVEL CORONAVIRUS, NAA: SARS-CoV-2, NAA: NOT DETECTED

## 2019-04-19 ENCOUNTER — Other Ambulatory Visit: Payer: PRIVATE HEALTH INSURANCE

## 2019-05-22 ENCOUNTER — Other Ambulatory Visit: Payer: Self-pay | Admitting: Physical Medicine & Rehabilitation

## 2019-05-22 DIAGNOSIS — IMO0002 Reserved for concepts with insufficient information to code with codable children: Secondary | ICD-10-CM

## 2019-05-22 DIAGNOSIS — G43709 Chronic migraine without aura, not intractable, without status migrainosus: Secondary | ICD-10-CM

## 2019-06-16 ENCOUNTER — Other Ambulatory Visit: Payer: Self-pay | Admitting: *Deleted

## 2019-06-16 NOTE — Telephone Encounter (Signed)
Error

## 2019-07-15 ENCOUNTER — Other Ambulatory Visit: Payer: Self-pay

## 2019-07-15 ENCOUNTER — Ambulatory Visit
Admission: EM | Admit: 2019-07-15 | Discharge: 2019-07-15 | Disposition: A | Discharge status: Home / Self Care | Payer: PRIVATE HEALTH INSURANCE | Attending: Emergency Medicine | Admitting: Emergency Medicine

## 2019-07-15 DIAGNOSIS — H9203 Otalgia, bilateral: Secondary | ICD-10-CM

## 2019-07-15 MED ORDER — FLUTICASONE PROPIONATE 50 MCG/ACT NA SUSP: 1.0000 | Freq: Every day | NASAL | 0 refills | Status: DC

## 2019-07-15 MED ORDER — CIPROFLOXACIN-DEXAMETHASONE 0.3-0.1 % OT SUSP: 4.0000 [drp] | Freq: Two times a day (BID) | OTIC | 0 refills | Status: DC

## 2019-07-15 NOTE — ED Triage Notes (Signed)
Pt reports r earache that started 2 days ago.  Reports left ear starting to hurt today.

## 2019-07-15 NOTE — Discharge Instructions (Addendum)
Rest and drink plenty of fluids Use Flonase as prescribed Prescribed ciprofloxacin ear drops/ Hold unless you develop symptom of otitis externa Take medications as directed and to completion Continue to use OTC ibuprofen and/ or tylenol as needed for pain control Follow up with PCP if symptoms persists Return here or go to the ER if you have any new or worsening symptoms   Reviewed expectations re: course of current medical issues. Questions answered. OE symptom was reviewed Outlined signs and symptoms indicating need for more acute intervention. Patient verbalized understanding. After Visit Summary given.
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
## 2019-07-30 ENCOUNTER — Ambulatory Visit: Payer: PRIVATE HEALTH INSURANCE | Attending: Internal Medicine

## 2019-07-30 DIAGNOSIS — Z23 Encounter for immunization: Secondary | ICD-10-CM

## 2019-07-30 NOTE — Progress Notes (Signed)
   Covid-19 Vaccination Clinic  Name:  Robin Stout    MRN: [id number] DOB: 04-19-1976  07/30/2019  [name] was observed post Covid-19 immunization for 15 minutes without incident. She was provided with Vaccine Information Sheet and instruction to access the V-Safe system.   [name] was instructed to call 911 with any severe reactions post vaccine: Marland Kitchen Difficulty breathing  . Swelling of face and throat  . A fast heartbeat  . A bad rash all over body  . Dizziness and weakness   Immunizations Administered    Name Date Dose VIS Date Route   Moderna COVID-19 Vaccine 07/30/2019  8:10 AM 0.5 mL 03/30/2019 Intramuscular   Manufacturer: Moderna   Lot: [id number]   NDC: 73225-672-09

## 2019-08-31 ENCOUNTER — Ambulatory Visit: Payer: Self-pay

## 2019-08-31 ENCOUNTER — Ambulatory Visit: Payer: PRIVATE HEALTH INSURANCE | Attending: Internal Medicine

## 2019-08-31 DIAGNOSIS — Z23 Encounter for immunization: Secondary | ICD-10-CM

## 2019-08-31 NOTE — Progress Notes (Signed)
   Covid-19 Vaccination Clinic  Name:  Caci Orren    MRN: [id number] DOB: 06/30/1975  08/31/2019  [name] was observed post Covid-19 immunization for 15 minutes without incident. She was provided with Vaccine Information Sheet and instruction to access the V-Safe system.   [name] was instructed to call 911 with any severe reactions post vaccine: Marland Kitchen Difficulty breathing  . Swelling of face and throat  . A fast heartbeat  . A bad rash all over body  . Dizziness and weakness   Immunizations Administered    Name Date Dose VIS Date Route   Moderna COVID-19 Vaccine 08/31/2019  9:07 AM 0.5 mL 03/2019 Intramuscular   Manufacturer: Moderna   Lot: [id number]   NDC: 21783-754-23

## 2019-09-08 ENCOUNTER — Other Ambulatory Visit (HOSPITAL_COMMUNITY): Payer: Self-pay | Admitting: Internal Medicine

## 2019-09-08 DIAGNOSIS — Z1231 Encounter for screening mammogram for malignant neoplasm of breast: Secondary | ICD-10-CM

## 2019-09-24 ENCOUNTER — Other Ambulatory Visit: Payer: Self-pay | Admitting: Physical Medicine & Rehabilitation

## 2019-09-24 DIAGNOSIS — IMO0002 Reserved for concepts with insufficient information to code with codable children: Secondary | ICD-10-CM

## 2019-10-06 ENCOUNTER — Other Ambulatory Visit (HOSPITAL_COMMUNITY): Payer: Self-pay | Admitting: Internal Medicine

## 2019-10-06 DIAGNOSIS — R928 Other abnormal and inconclusive findings on diagnostic imaging of breast: Secondary | ICD-10-CM

## 2019-10-25 ENCOUNTER — Ambulatory Visit (HOSPITAL_COMMUNITY): Payer: PRIVATE HEALTH INSURANCE

## 2019-11-02 ENCOUNTER — Other Ambulatory Visit: Payer: Self-pay

## 2019-11-02 ENCOUNTER — Ambulatory Visit (HOSPITAL_COMMUNITY)
Admission: RE | Admit: 2019-11-02 | Discharge: 2019-11-02 | Disposition: A | Discharge status: Home / Self Care | Payer: PRIVATE HEALTH INSURANCE | Source: Ambulatory Visit | Attending: Internal Medicine | Admitting: Internal Medicine

## 2019-11-02 ENCOUNTER — Other Ambulatory Visit (HOSPITAL_COMMUNITY): Payer: PRIVATE HEALTH INSURANCE

## 2019-11-02 DIAGNOSIS — R928 Other abnormal and inconclusive findings on diagnostic imaging of breast: Secondary | ICD-10-CM | POA: Insufficient documentation

## 2020-01-02 ENCOUNTER — Ambulatory Visit
Admission: EM | Admit: 2020-01-02 | Discharge: 2020-01-02 | Disposition: A | Discharge status: Home / Self Care | Payer: PRIVATE HEALTH INSURANCE | Attending: Emergency Medicine | Admitting: Emergency Medicine

## 2020-01-02 ENCOUNTER — Other Ambulatory Visit: Payer: Self-pay

## 2020-01-02 DIAGNOSIS — J029 Acute pharyngitis, unspecified: Secondary | ICD-10-CM | POA: Diagnosis present

## 2020-01-02 LAB — POCT RAPID STREP A (OFFICE): Rapid Strep A Screen: NEGATIVE

## 2020-01-02 NOTE — Discharge Instructions (Signed)
Strep test negative, will send out for culture and we will call you with results Get plenty of rest and push fluids Drink warm or cool liquids, use throat lozenges, or popsicles to help alleviate symptoms Take OTC ibuprofen or tylenol as needed for pain Follow up with PCP if symptoms persists Return or go to ER if patient has any new or worsening symptoms such as fever, chills, nausea, vomiting, worsening sore throat, cough, abdominal pain, chest pain, changes in bowel or bladder habits, etc... 

## 2020-01-02 NOTE — ED Provider Notes (Signed)
[facility]   [id number] 01/02/20 Arrival Time: 1148  TK:ZSWF THROAT  SUBJECTIVE: History from: patient.  [facility] Mayford Knife is a 44 y.o. female who presents with sore throat x 4 days.  Denies sick exposure to strep, flu or mono.  States she inhaled some salt water while on vacation.  Has tried OTC medications without relief.  Symptoms are made worse with swallowing, but tolerating liquids and own secretions without difficulty.  Reports previous symptoms in the past with strep.  Denies fever, chills, fatigue, ear pain, sinus pain, rhinorrhea, nasal congestion, cough, SOB, wheezing, chest pain, nausea, rash, changes in bowel or bladder habits.    Negative COVID test 3 days ago.  ROS: As per HPI.  All other pertinent ROS negative.     Past Medical History:  Diagnosis Date  . Liver lesion    pt says she is not aware  . Migraines   . Sigmoid diverticulum    pt denies   Past Surgical History:  Procedure Laterality Date  . ABDOMINAL HYSTERECTOMY    . abdominal plasty  03/2015  . APPENDECTOMY    . CESAREAN SECTION     Allergies  Allergen Reactions  . Penicillins Swelling  . Inderal [Propranolol]     Severe fatigue   No current facility-administered medications on file prior to encounter.   Current Outpatient Medications on File Prior to Encounter  Medication Sig Dispense Refill  . Erenumab-aooe (AIMOVIG) 70 MG/ML SOAJ Inject 70 mg into the skin every 30 (thirty) days. 3 pen 6  . escitalopram (LEXAPRO) 10 MG tablet Take 2 tablets (20 mg total) by mouth at bedtime. 60 tablet 6  . fluticasone (FLONASE) 50 MCG/ACT nasal spray Place 1 spray into both nostrils daily for 14 days. 16 g 0  . methylphenidate (RITALIN) 10 MG tablet Take 0.5-1 tablets (5-10 mg total) by mouth 3 (three) times daily. 75 tablet 0  . promethazine (PHENERGAN) 25 MG tablet Take 1 tablet (25 mg total) by mouth every 8 (eight) hours as needed for nausea or vomiting. 90 tablet 0  . SUMAtriptan (IMITREX)  100 MG tablet TAKE 1 TABLET BY MOUTH AS NEEDED FOR MIGRAINE IF HEADACHE PERSIST OR RECURS MAY TAKE 1 TABLET 2 HOURS LATER 10 tablet 5   Social History   Socioeconomic History  . Marital status: Single    Spouse name: Not on file  . Number of children: Not on file  . Years of education: Not on file  . Highest education level: Not on file  Occupational History  . Not on file  Tobacco Use  . Smoking status: Never Smoker  . Smokeless tobacco: Never Used  Substance and Sexual Activity  . Alcohol use: Yes    Alcohol/week: 0.0 standard drinks    Comment: occasional  . Drug use: No  . Sexual activity: Not on file  Other Topics Concern  . Not on file  Social History Narrative  . Not on file   Social Determinants of Health   Financial Resource Strain:   . Difficulty of Paying Living Expenses: Not on file  Food Insecurity:   . Worried About Programme researcher, broadcasting/film/video in the Last Year: Not on file  . Ran Out of Food in the Last Year: Not on file  Transportation Needs:   . Lack of Transportation (Medical): Not on file  . Lack of Transportation (Non-Medical): Not on file  Physical Activity:   . Days of Exercise per Week: Not on file  . Minutes  of Exercise per Session: Not on file  Stress:   . Feeling of Stress : Not on file  Social Connections:   . Frequency of Communication with Friends and Family: Not on file  . Frequency of Social Gatherings with Friends and Family: Not on file  . Attends Religious Services: Not on file  . Active Member of Clubs or Organizations: Not on file  . Attends Banker Meetings: Not on file  . Marital Status: Not on file  Intimate Partner Violence:   . Fear of Current or Ex-Partner: Not on file  . Emotionally Abused: Not on file  . Physically Abused: Not on file  . Sexually Abused: Not on file   Family History  Problem Relation Age of Onset  . Multiple sclerosis Mother   . Diabetes Father   . Heart disease Father   . Hypertension Father     . Stroke Father     OBJECTIVE:  Vitals:   01/02/20 1207  BP: 116/79  Pulse: 74  Resp: 20  Temp: 98.6 F (37 C)  SpO2: 96%     General appearance: alert; well-appearing, nontoxic; speaking in full sentences and tolerating own secretions HEENT: NCAT; Ears: EACs clear, TMs pearly gray; Eyes: PERRL.  EOM grossly intact.Nose: nares patent without rhinorrhea, Throat: oropharynx clear, tonsils mildly erythematous not enlarged, uvula midline  Neck: supple without LAD Lungs: unlabored respirations, symmetrical air entry; cough: absent; no respiratory distress; CTAB Heart: regular rate and rhythm.  Skin: warm and dry Psychological: alert and cooperative; normal mood and affect   ASSESSMENT & PLAN:  1. Sore throat   2. Viral pharyngitis     Strep test negative, will send out for culture and we will call you with results Get plenty of rest and push fluids Drink warm or cool liquids, use throat lozenges, or popsicles to help alleviate symptoms Take OTC ibuprofen or tylenol as needed for pain Follow up with PCP if symptoms persists Return or go to ER if patient has any new or worsening symptoms such as fever, chills, nausea, vomiting, worsening sore throat, cough, abdominal pain, chest pain, changes in bowel or bladder habits, etc...  Reviewed expectations re: course of current medical issues. Questions answered. Outlined signs and symptoms indicating need for more acute intervention. Patient verbalized understanding. After Visit Summary given.        Rennis Harding, PA-C 01/02/20 1220

## 2020-01-02 NOTE — ED Triage Notes (Signed)
Pt presents with c/o sore throat for past few days, worse on left than right, has had 2 negative covid test

## 2020-01-05 ENCOUNTER — Ambulatory Visit
Admission: EM | Admit: 2020-01-05 | Discharge: 2020-01-05 | Disposition: A | Discharge status: Home / Self Care | Payer: PRIVATE HEALTH INSURANCE | Attending: Emergency Medicine | Admitting: Emergency Medicine

## 2020-01-05 ENCOUNTER — Encounter: Payer: Self-pay | Admitting: Emergency Medicine

## 2020-01-05 DIAGNOSIS — J069 Acute upper respiratory infection, unspecified: Secondary | ICD-10-CM | POA: Diagnosis not present

## 2020-01-05 DIAGNOSIS — R0981 Nasal congestion: Secondary | ICD-10-CM

## 2020-01-05 LAB — CULTURE, GROUP A STREP (THRC)

## 2020-01-05 MED ORDER — AZITHROMYCIN 250 MG PO TABS: 250.0000 mg | ORAL_TABLET | Freq: Every day | ORAL | 0 refills | Status: DC

## 2020-01-05 MED ORDER — CETIRIZINE HCL 10 MG PO CAPS: 10.0000 mg | ORAL_CAPSULE | Freq: Every day | ORAL | 0 refills | Status: DC

## 2020-01-05 MED ORDER — IBUPROFEN 800 MG PO TABS: 800.0000 mg | ORAL_TABLET | Freq: Three times a day (TID) | ORAL | 0 refills | Status: DC

## 2020-01-05 NOTE — ED Triage Notes (Signed)
Sore throat x 1 week that will not get better is also starting to have some congesiton.  Seen here and had negative strep test. Had neg rapid covid test this past Thursday.

## 2020-01-05 NOTE — Discharge Instructions (Signed)
Begin z pack to cover for infection Use anti-inflammatories for pain/swelling. You may take up to 800 mg Ibuprofen every 8 hours with food. You may supplement Ibuprofen with Tylenol [phone] mg every 8 hours.  Daily cetirizine/zyrtec or loratadine/Claritin for any drainage contributing to symptoms Follow up if not improving or worsening

## 2020-01-05 NOTE — ED Provider Notes (Signed)
[facility]    CSN: [id number] Arrival date & time: 01/05/20  6962      History   Chief Complaint Chief Complaint  Patient presents with  . Sore Throat    HPI Robin Stout is a 44 y.o. female presenting today for evaluation of sore throat.  Patient has had a sore throat for approximately 1 week.  Over the past day Robin Stout has developed some nasal congestion and rhinorrhea as well.  Reports Robin Stout was in the [place] when her symptoms started and began after Robin Stout had swallowed some salt water.  Robin Stout had rapid Covid test which were negative prior to returning home.  Was seen here approximately 3 days ago and had negative rapid strep, culture still pending, but initial growth negative.  Feels sore throat has worsened since has felt more swollen.  Denies any fevers.  Has had some abdominal discomfort.  Denies diarrhea nausea or vomiting.  Denies any close sick contacts.    HPI  Past Medical History:  Diagnosis Date  . Liver lesion    pt says Robin Stout is not aware  . Migraines   . Sigmoid diverticulum    pt denies    Patient Active Problem List   Diagnosis Date Noted  . Tremor 06/02/2017  . PTSD (post-traumatic stress disorder) 02/17/2017  . 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 Essential tremor 07/04/2016  . Postconcussion syndrome 06/10/2016  . Head injury 06/10/2016  . Other complicated headache syndrome 06/10/2016  . Neck pain 06/10/2016  . Decreased body weight 12/07/2014  . 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 Chronic migraine     Past Surgical History:  Procedure Laterality Date  . ABDOMINAL HYSTERECTOMY    . abdominal plasty  03/2015  . APPENDECTOMY    . CESAREAN SECTION      OB History   No obstetric history on file.      Home Medications    Prior to Admission medications   Medication Sig Start Date End Date Taking? Authorizing Provider  azithromycin (ZITHROMAX) 250 MG tablet Take 1 tablet (250 mg total) by mouth daily. Take first 2 tablets together,  then 1 every day until finished. 01/05/20   Andree Heeg C, PA-C  Cetirizine HCl 10 MG CAPS Take 1 capsule (10 mg total) by mouth daily for 10 days. 01/05/20 01/15/20  Leonie Amacher C, PA-C  Erenumab-aooe (AIMOVIG) 70 MG/ML SOAJ Inject 70 mg into the skin every 30 (thirty) days. 05/06/18   Ranelle Oyster, MD  escitalopram (LEXAPRO) 10 MG tablet Take 2 tablets (20 mg total) by mouth at bedtime. 05/06/18   Ranelle Oyster, MD  fluticasone (FLONASE) 50 MCG/ACT nasal spray Place 1 spray into both nostrils daily for 14 days. 07/15/19 07/29/19  Avegno, Zachery Dakins, FNP  ibuprofen (ADVIL) 800 MG tablet Take 1 tablet (800 mg total) by mouth 3 (three) times daily. 01/05/20   Italia Wolfert C, PA-C  methylphenidate (RITALIN) 10 MG tablet Take 0.5-1 tablets (5-10 mg total) by mouth 3 (three) times daily. 07/21/18   Jones Bales, NP  promethazine (PHENERGAN) 25 MG tablet Take 1 tablet (25 mg total) by mouth every 8 (eight) hours as needed for nausea or vomiting. 06/10/16   Sater, Pearletha Furl, MD  SUMAtriptan (IMITREX) 100 MG tablet TAKE 1 TABLET BY MOUTH AS NEEDED FOR MIGRAINE IF HEADACHE PERSIST OR RECURS MAY TAKE 1 TABLET 2 HOURS LATER 09/24/19   Ranelle Oyster, MD    Family History Family History  Problem Relation Age  of Onset  . Multiple sclerosis Mother   . Diabetes Father   . Heart disease Father   . Hypertension Father   . Stroke Father     Social History Social History   Tobacco Use  . Smoking status: Never Smoker  . Smokeless tobacco: Never Used  Substance Use Topics  . Alcohol use: Yes    Alcohol/week: 0.0 standard drinks    Comment: occasional  . Drug use: No     Allergies   Penicillins and Inderal [propranolol]   Review of Systems Review of Systems  Constitutional: Negative for activity change, appetite change, chills, fatigue and fever.  HENT: Positive for congestion, rhinorrhea and sore throat. Negative for ear pain, sinus pressure and trouble swallowing.   Eyes: Negative  for discharge and redness.  Respiratory: Negative for cough, chest tightness and shortness of breath.   Cardiovascular: Negative for chest pain.  Gastrointestinal: Positive for abdominal pain. Negative for diarrhea, nausea and vomiting.  Musculoskeletal: Negative for myalgias.  Skin: Negative for rash.  Neurological: Negative for dizziness, light-headedness and headaches.     Physical Exam Triage Vital Signs ED Triage Vitals  Enc Vitals Group     BP      Pulse      Resp      Temp      Temp src      SpO2      Weight      Height      Head Circumference      Peak Flow      Pain Score      Pain Loc      Pain Edu?      Excl. in GC?    No data found.  Updated Vital Signs BP 117/83 (BP Location: Right Arm)   Pulse 88   Temp 98.4 F (36.9 C) (Oral)   Resp 18   SpO2 97%   Visual Acuity Right Eye Distance:   Left Eye Distance:   Bilateral Distance:    Right Eye Near:   Left Eye Near:    Bilateral Near:     Physical Exam Vitals and nursing note reviewed.  Constitutional:      Appearance: Robin Stout is well-developed.     Comments: No acute distress  HENT:     Head: Normocephalic and atraumatic.     Ears:     Comments: Bilateral ears without tenderness to palpation of external auricle, tragus and mastoid, EAC's without erythema or swelling, TM's with good bony landmarks and cone of light. Non erythematous.     Nose: Nose normal.     Mouth/Throat:     Comments: Oral mucosa pink and moist, no tonsillar enlargement or exudate. Posterior pharynx patent and erythematous, no uvula deviation or swelling. Normal phonation. Eyes:     Conjunctiva/sclera: Conjunctivae normal.  Cardiovascular:     Rate and Rhythm: Normal rate and regular rhythm.  Pulmonary:     Effort: Pulmonary effort is normal. No respiratory distress.     Comments: Breathing comfortably at rest, CTABL, no wheezing, rales or other adventitious sounds auscultated Abdominal:     General: There is no distension.    Musculoskeletal:        General: Normal range of motion.     Cervical back: Neck supple.  Skin:    General: Skin is warm and dry.  Neurological:     Mental Status: Robin Stout is alert and oriented to person, place, and time.      UC Treatments /  Results  Labs (all labs ordered are listed, but only abnormal results are displayed) Labs Reviewed  NOVEL CORONAVIRUS, NAA    EKG   Radiology No results found.  Procedures Procedures (including critical care time)  Medications Ordered in UC Medications - No data to display  Initial Impression / Assessment and Plan / UC Course  I have reviewed the triage vital signs and the nursing notes.  Pertinent labs & imaging results that were available during my care of the patient were reviewed by me and considered in my medical decision making (see chart for details).     No sign of tonsillitis, strep culture pending, recommending continuing symptomatic and supportive care with anti-inflammatories, daily antihistamine for any drainage contributing to sore throat.  Given symptoms x1 week with recent worsening we will go ahead and cover for bacterial causes and treat with azithromycin.  Has allergy to penicillins.  Continue rest and fluids.  Covid test pending today.  Discussed strict return precautions. Patient verbalized understanding and is agreeable with plan.  Final Clinical Impressions(s) / UC Diagnoses   Final diagnoses:  Nasal congestion  Acute upper respiratory infection     Discharge Instructions     Begin z pack to cover for infection Use anti-inflammatories for pain/swelling. You may take up to 800 mg Ibuprofen every 8 hours with food. You may supplement Ibuprofen with Tylenol [phone] mg every 8 hours.  Daily cetirizine/zyrtec or loratadine/Claritin for any drainage contributing to symptoms Follow up if not improving or worsening    ED Prescriptions    Medication Sig Dispense Auth. Provider   azithromycin (ZITHROMAX)  250 MG tablet Take 1 tablet (250 mg total) by mouth daily. Take first 2 tablets together, then 1 every day until finished. 6 tablet Sharron Petruska C, PA-C   ibuprofen (ADVIL) 800 MG tablet Take 1 tablet (800 mg total) by mouth 3 (three) times daily. 21 tablet Alisse Tuite C, PA-C   Cetirizine HCl 10 MG CAPS Take 1 capsule (10 mg total) by mouth daily for 10 days. 10 capsule Yukiko Minnich, [place] C, PA-C     PDMP not reviewed this encounter.   Lew Dawes, [place] 01/05/20 [phone]

## 2020-01-07 LAB — SARS-COV-2, NAA 2 DAY TAT

## 2020-01-07 LAB — NOVEL CORONAVIRUS, NAA: SARS-CoV-2, NAA: NOT DETECTED

## 2020-09-24 ENCOUNTER — Emergency Department (HOSPITAL_COMMUNITY): Payer: No Typology Code available for payment source

## 2020-09-24 ENCOUNTER — Other Ambulatory Visit: Payer: Self-pay

## 2020-09-24 ENCOUNTER — Emergency Department (HOSPITAL_COMMUNITY)
Admission: EM | Admit: 2020-09-24 | Discharge: 2020-09-24 | Disposition: A | Discharge status: Home / Self Care | Payer: No Typology Code available for payment source | Attending: Emergency Medicine | Admitting: Emergency Medicine

## 2020-09-24 ENCOUNTER — Encounter (HOSPITAL_COMMUNITY): Payer: Self-pay | Admitting: *Deleted

## 2020-09-24 DIAGNOSIS — R1030 Lower abdominal pain, unspecified: Secondary | ICD-10-CM | POA: Diagnosis present

## 2020-09-24 LAB — CBC WITH DIFFERENTIAL/PLATELET
Abs Immature Granulocytes: 0.01 10*3/uL (ref 0.00–0.07)
Basophils Absolute: 0 10*3/uL (ref 0.0–0.1)
Basophils Relative: 1 %
Eosinophils Absolute: 0.1 10*3/uL (ref 0.0–0.5)
Eosinophils Relative: 2 %
HCT: 43 % (ref 36.0–46.0)
Hemoglobin: 13.7 g/dL (ref 12.0–15.0)
Immature Granulocytes: 0 %
Lymphocytes Relative: 34 %
Lymphs Abs: 1.8 10*3/uL (ref 0.7–4.0)
MCH: 27.1 pg (ref 26.0–34.0)
MCHC: 31.9 g/dL (ref 30.0–36.0)
MCV: 85.1 fL (ref 80.0–100.0)
Monocytes Absolute: 0.3 10*3/uL (ref 0.1–1.0)
Monocytes Relative: 5 %
Neutro Abs: 3 10*3/uL (ref 1.7–7.7)
Neutrophils Relative %: 58 %
Platelets: 219 10*3/uL (ref 150–400)
RBC: 5.05 MIL/uL (ref 3.87–5.11)
RDW: 14.1 % (ref 11.5–15.5)
WBC: 5.3 10*3/uL (ref 4.0–10.5)
nRBC: 0 % (ref 0.0–0.2)

## 2020-09-24 LAB — COMPREHENSIVE METABOLIC PANEL
ALT: 10 U/L (ref 0–44)
AST: 16 U/L (ref 15–41)
Albumin: 4.2 g/dL (ref 3.5–5.0)
Alkaline Phosphatase: 32 U/L — ABNORMAL LOW (ref 38–126)
Anion gap: 6 (ref 5–15)
BUN: 14 mg/dL (ref 6–20)
CO2: 26 mmol/L (ref 22–32)
Calcium: 8.9 mg/dL (ref 8.9–10.3)
Chloride: 103 mmol/L (ref 98–111)
Creatinine, Ser: 0.7 mg/dL (ref 0.44–1.00)
GFR, Estimated: >60 mL/min (ref >60)
Glucose, Bld: 95 mg/dL (ref 70–99)
Potassium: 4 mmol/L (ref 3.5–5.1)
Sodium: 135 mmol/L (ref 135–145)
Total Bilirubin: 0.6 mg/dL (ref 0.3–1.2)
Total Protein: 6.6 g/dL (ref 6.5–8.1)

## 2020-09-24 LAB — URINALYSIS, ROUTINE W REFLEX MICROSCOPIC
Bilirubin Urine: NEGATIVE
Glucose, UA: NEGATIVE mg/dL
Hgb urine dipstick: NEGATIVE
Ketones, ur: 5 mg/dL — AB
Leukocytes,Ua: NEGATIVE
Nitrite: NEGATIVE
Protein, ur: NEGATIVE mg/dL
Specific Gravity, Urine: 1.025 (ref 1.005–1.030)
pH: 6 (ref 5.0–8.0)

## 2020-09-24 LAB — LIPASE, BLOOD: Lipase: 49 U/L (ref 11–51)

## 2020-09-24 MED ORDER — DICYCLOMINE HCL 10 MG/ML IM SOLN
20.0000 mg | Freq: Once | INTRAMUSCULAR | Status: AC
  Administered 2020-09-24: 20 mg via INTRAMUSCULAR
  Filled 2020-09-24: qty 2

## 2020-09-24 MED ORDER — SODIUM CHLORIDE 0.9 % IV BOLUS
1000.0000 mL | Freq: Once | INTRAVENOUS | Status: AC
  Administered 2020-09-24: 1000 mL via INTRAVENOUS

## 2020-09-24 MED ORDER — FENTANYL CITRATE (PF) 100 MCG/2ML IJ SOLN
75.0000 ug | Freq: Once | INTRAMUSCULAR | Status: AC
  Administered 2020-09-24: 75 ug via INTRAVENOUS
  Filled 2020-09-24: qty 2

## 2020-09-24 MED ORDER — ONDANSETRON HCL 4 MG PO TABS: 4.0000 mg | ORAL_TABLET | Freq: Four times a day (QID) | ORAL | 0 refills | Status: DC

## 2020-09-24 MED ORDER — MORPHINE SULFATE (PF) 4 MG/ML IV SOLN
4.0000 mg | Freq: Once | INTRAVENOUS | Status: AC
  Administered 2020-09-24: 4 mg via INTRAVENOUS
  Filled 2020-09-24: qty 1

## 2020-09-24 MED ORDER — DICYCLOMINE HCL 20 MG PO TABS: 20.0000 mg | ORAL_TABLET | Freq: Two times a day (BID) | ORAL | 0 refills | Status: DC

## 2020-09-24 MED ORDER — ONDANSETRON HCL 4 MG/2ML IJ SOLN
4.0000 mg | Freq: Once | INTRAMUSCULAR | Status: AC
  Administered 2020-09-24: 4 mg via INTRAVENOUS
  Filled 2020-09-24: qty 2

## 2020-09-24 NOTE — ED Provider Notes (Signed)
Robin Stout Provider Note   CSN: [id number] Arrival date & time: 09/24/20  1736     History Chief Complaint  Patient presents with  . Abdominal Pain    [facility] Robin Stout is a 45 y.o. female.  HPI   Patient with significant medical history of hysterectomy, appendectomy, abdominal plasty presents to the emergency Stout with chief complaint of lower abdominal pain and urinary symptoms.  Patient states she had a UTI a week and a half ago, she was started on Bactrim and finished the antibiotics on Wednesday, she states since then she has been having worsening abdominal pain, she states it is in her lower abdomen and then radiates around into her bilateral flanks, she has associated urinary frequency and feelings of not fully void. she denies hematuria, dysuria, vaginal discharge or vaginal bleeding.  She has associated nausea without vomiting, states she has lost her appetite, she does not have any systemic infection fevers or chills.  Patient has no history of bowel obstruction, diverticulitis, pancreatitis, kidney stones, states she has had ovarian cysts in the past but this does not feel similar to this episode.  She denies any alleviating factors.  Patient denies headaches, fevers, chills, shortness of breath, chest pain, vomiting, diarrhea, constipation, worsening pedal edema.  Past Medical History:  Diagnosis Date  . Liver lesion    pt says she is not aware  . Migraines   . Sigmoid diverticulum    pt denies    Patient Active Problem List   Diagnosis Date Noted  . Tremor 06/02/2017  . PTSD (post-traumatic stress disorder) 02/17/2017  . 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 Essential tremor 07/04/2016  . Postconcussion syndrome 06/10/2016  . Head injury 06/10/2016  . Other complicated headache syndrome 06/10/2016  . Neck pain 06/10/2016  . Decreased body weight 12/07/2014  . 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 Chronic migraine     Past Surgical  History:  Procedure Laterality Date  . ABDOMINAL HYSTERECTOMY    . abdominal plasty  03/2015  . APPENDECTOMY    . CESAREAN SECTION       OB History   No obstetric history on file.     Family History  Problem Relation Age of Onset  . Multiple sclerosis Mother   . Diabetes Father   . Heart disease Father   . Hypertension Father   . Stroke Father     Social History   Tobacco Use  . Smoking status: Never Smoker  . Smokeless tobacco: Never Used  Substance Use Topics  . Alcohol use: Yes    Alcohol/week: 0.0 standard drinks    Comment: occasional  . Drug use: No    Home Medications Prior to Admission medications   Medication Sig Start Date End Date Taking? Authorizing Provider  Chlorpheniramine-PSE-Ibuprofen (ADVIL MULTI-SYMPTOM COLD PO) Take 2 tablets by mouth daily as needed (pain).   Yes [provider]  dicyclomine (BENTYL) 20 MG tablet Take 1 tablet (20 mg total) by mouth 2 (two) times daily. 09/24/20  Yes Carroll Sage, PA-C  Erenumab-aooe (AIMOVIG) 70 MG/ML SOAJ Inject 70 mg into the skin every 30 (thirty) days. 05/06/18  Yes Ranelle Oyster, MD  ondansetron (ZOFRAN) 4 MG tablet Take 1 tablet (4 mg total) by mouth every 6 (six) hours. 09/24/20  Yes Carroll Sage, PA-C  phenazopyridine (PYRIDIUM) 200 MG tablet Take 200 mg by mouth 3 (three) times daily. 09/14/20  Yes [provider]  simvastatin (ZOCOR) 10 MG tablet Take 10  mg by mouth at bedtime. 08/29/20  Yes [provider]  azithromycin (ZITHROMAX) 250 MG tablet Take 1 tablet (250 mg total) by mouth daily. Take first 2 tablets together, then 1 every day until finished. Patient not taking: No sig reported 01/05/20   Wieters, Hallie C, PA-C  Cetirizine HCl 10 MG CAPS Take 1 capsule (10 mg total) by mouth daily for 10 days. Patient not taking: Reported on 09/24/2020 01/05/20 01/15/20  Wieters, Hallie C, PA-C  escitalopram (LEXAPRO) 10 MG tablet Take 2 tablets (20 mg total) by mouth at  bedtime. Patient not taking: No sig reported 05/06/18   Ranelle OysterSwartz, Zachary T, MD  fluticasone [facility](FLONASE) 50 MCG/ACT nasal spray Place 1 spray into both nostrils daily for 14 days. Patient not taking: Reported on 09/24/2020 07/15/19 07/29/19  Durward ParcelAvegno, Komlanvi S, FNP  ibuprofen (ADVIL) 800 MG tablet Take 1 tablet (800 mg total) by mouth 3 (three) times daily. Patient not taking: No sig reported 01/05/20   Wieters, Hallie C, PA-C  methylphenidate (RITALIN) 10 MG tablet Take 0.5-1 tablets (5-10 mg total) by mouth 3 (three) times daily. Patient not taking: No sig reported 07/21/18   Jones Baleshomas, Eunice L, NP  promethazine (PHENERGAN) 25 MG tablet Take 1 tablet (25 mg total) by mouth every 8 (eight) hours as needed for nausea or vomiting. Patient not taking: No sig reported 06/10/16   Asa LenteSater, Richard A, MD  sulfamethoxazole-trimethoprim (BACTRIM DS) 800-160 MG tablet SMARTSIG:1 Tablet(s) By Mouth Every 12 Hours Patient not taking: No sig reported 09/14/20   [provider]  SUMAtriptan (IMITREX) 100 MG tablet TAKE 1 TABLET BY MOUTH AS NEEDED FOR MIGRAINE IF HEADACHE PERSIST OR RECURS MAY TAKE 1 TABLET 2 HOURS LATER Patient not taking: No sig reported 09/24/19   Ranelle OysterSwartz, Zachary T, MD    Allergies    Penicillins and Inderal [propranolol]  Review of Systems   Review of Systems  Constitutional: Negative for chills and fever.  HENT: Negative for congestion.   Respiratory: Negative for shortness of breath.   Cardiovascular: Negative for chest pain.  Gastrointestinal: Positive for abdominal pain and nausea. Negative for constipation, diarrhea and vomiting.  Genitourinary: Positive for difficulty urinating, flank pain and frequency. Negative for decreased urine volume, dysuria, enuresis, hematuria, vaginal bleeding and vaginal discharge.  Musculoskeletal: Negative for back pain.  Skin: Negative for rash.  Neurological: Negative for dizziness and headaches.  Hematological: Does not bruise/bleed easily.     Physical Exam Updated Vital Signs BP 111/70   Pulse (!) 57   Temp 99.3 F (37.4 C) (Oral)   Resp 14   Ht 5\' 1"  (1.549 m)   Wt 95.3 kg   SpO2 98%   BMI 39.68 kg/m   Physical Exam Vitals and nursing note reviewed.  Constitutional:      General: She is not in acute distress.    Appearance: She is not ill-appearing.  HENT:     Head: Normocephalic and atraumatic.     Nose: No congestion.  Eyes:     Conjunctiva/sclera: Conjunctivae normal.  Cardiovascular:     Rate and Rhythm: Normal rate and regular rhythm.     Pulses: Normal pulses.     Heart sounds: No murmur heard. No friction rub. No gallop.   Pulmonary:     Effort: No respiratory distress.     Breath sounds: No wheezing, rhonchi or rales.  Abdominal:     Palpations: Abdomen is soft.     Tenderness: There is abdominal tenderness. There is right CVA  tenderness and left CVA tenderness. There is no guarding or rebound.     Comments: Abdomen visualized nondistended, normoactive bowel sounds, dull to percussion, she had noted tenderness in her lower abdominal been worse on the left versus the right, negative guarding, rebound tenderness, peritoneal sign, negative McBurney point, she had positive CVA tenderness bilaterally.  Musculoskeletal:     Right lower leg: No edema.     Left lower leg: No edema.  Skin:    General: Skin is warm and dry.  Neurological:     Mental Status: She is alert.  Psychiatric:        Mood and Affect: Mood normal.     ED Results / Procedures / Treatments   Labs (all labs ordered are listed, but only abnormal results are displayed) Labs Reviewed  COMPREHENSIVE METABOLIC PANEL - Abnormal; Notable for the following components:      Result Value   Alkaline Phosphatase 32 (*)    All other components within normal limits  URINALYSIS, ROUTINE W REFLEX MICROSCOPIC - Abnormal; Notable for the following components:   APPearance HAZY (*)    Ketones, ur 5 (*)    All other components within normal  limits  CBC WITH DIFFERENTIAL/PLATELET  LIPASE, BLOOD    EKG None  Radiology CT ABDOMEN PELVIS WO CONTRAST  Result Date: 09/24/2020 CLINICAL DATA:  45 year old female with abdominal pain and nausea. Recurrent UTIs. EXAM: CT ABDOMEN AND PELVIS WITHOUT CONTRAST TECHNIQUE: Multidetector CT imaging of the abdomen and pelvis was performed following the standard protocol without IV contrast. COMPARISON:  CT abdomen pelvis dated 06/08/2015. FINDINGS: Evaluation of this exam is limited in the absence of intravenous contrast. Lower chest: The visualized lung bases are clear. No intra-abdominal free air or free fluid. Hepatobiliary: No focal liver abnormality is seen. No gallstones, gallbladder wall thickening, or biliary dilatation. Pancreas: There is mild peripancreatic haziness. Correlation with pancreatic enzymes recommended to evaluate for acute pancreatitis. Spleen: Normal in size without focal abnormality. Adrenals/Urinary Tract: The adrenal glands unremarkable. The kidneys, visualized ureters, and urinary bladder appear unremarkable. Stomach/Bowel: There is moderate stool throughout the colon. There is no bowel obstruction or active inflammation. Appendectomy. Vascular/Lymphatic: The abdominal aorta and IVC are unremarkable. No portal venous gas. There is no adenopathy. Reproductive: Hysterectomy. No adnexal masses. Other: Midline vertical anterior pelvic wall incisional scar. Musculoskeletal: No acute or significant osseous findings. IMPRESSION: 1. Possible acute pancreatitis. Correlation with pancreatic enzymes recommended 2. Moderate colonic stool burden. No bowel obstruction. Electronically Signed   By: Elgie Collard M.D.   On: 09/24/2020 18:39    Procedures Procedures   Medications Ordered in ED Medications  sodium chloride 0.9 % bolus 1,000 mL (0 mLs Intravenous Stopped 09/24/20 1922)  morphine 4 MG/ML injection 4 mg (4 mg Intravenous Given 09/24/20 1817)  ondansetron (ZOFRAN) injection 4  mg (4 mg Intravenous Given 09/24/20 1816)  fentaNYL (SUBLIMAZE) injection 75 mcg (75 mcg Intravenous Given 09/24/20 1920)  dicyclomine (BENTYL) injection 20 mg (20 mg Intramuscular Given 09/24/20 1923)    ED Course  I have reviewed the triage vital signs and the nursing notes.  Pertinent labs & imaging results that were available during my care of the patient were reviewed by me and considered in my medical decision making (see chart for details).    MDM Rules/Calculators/A&P                         Initial impression-patient presents with lower abdominal pain and urinary  symptoms.  She is alert, does not appear to be in acute distress, vital signs reassuring.  Concern for Pilo, kidney stones, abdominal abscess.  Will obtain basic lab work-up, provide patient fluids, pain medications, antiemetics and reassess.  Work-up-CBC unremarkable, BMP unremarkable, UA unremarkable, lipase is 49, CT imaging reveals possible acute pancreatitis correlate with pancreatic enzymes.  Moderate: Stool burden without bowel obstruction.  Reassessment patient was reassessed after morphine and fluids, she states she continues to have some pain.  will try fentanyl as well as Bentyl and reassess.  Patient was reassessed she states she is feeling better, vital signs remained stable.  Updated her on lab work and imaging, patient is agreeable for discharge at this time.  Rule out-low suspicion for UTI, pyelonephritis, kidney stone as UA is negative for hematuria or signs infection, CT imaging is negative for stones.  Low suspicion for appendicitis as patient has no peritoneal sign, McBurney point, no leukocytosis present, vital signs reassuring, presentation is not consistent with etiology as she has been having pain since Wednesday came on gradually.  Low suspicion for bowel obstruction as abdomen is nondistended, dull to percussion, no peritoneal sign present.  Low suspicion for pancreatitis as patient has no epigastric  pain, lipase is 49, I suspect reviewed on CT imaging is an over read.  Low suspicion for abscess as patient has not had recent surgeries, she does not have autoimmune disease like Crohn's or colitis.  Plan-  1.  Generalized abdominal pain improved-I suspect this is secondary due to constipation as she was recently on antibiotics which could have upset her GI track caused her to have a large stool burden.  Will start her on laxatives, increase hydration, exercise, follow-up with GI as well as urology for frequent UTIs.   Vital signs have remained stable, no indication for hospital admission.  Patient given at home care as well strict return precautions.  Patient verbalized that they understood agreed to said plan.   Final Clinical Impression(s) / ED Diagnoses Final diagnoses:  Lower abdominal pain    Rx / DC Orders ED Discharge Orders         Ordered    dicyclomine (BENTYL) 20 MG tablet  2 times daily        09/24/20 2009    ondansetron (ZOFRAN) 4 MG tablet  Every 6 hours        09/24/20 2010           Barnie Del 09/24/20 2013    Eber Hong, MD 09/24/20 [phone]

## 2020-09-24 NOTE — ED Triage Notes (Signed)
Abdominal pain, history of UTI

## 2020-09-24 NOTE — ED Notes (Signed)
Pt returned from CT °

## 2020-09-24 NOTE — Discharge Instructions (Addendum)
Lab work and imaging were reassuring.  Imaging does show a large stool burden this could be the cause of your abdominal pain.  I would like you to start taking MiraLAX daily for the next week.  1 capful per day.  Please drink plenty of fluids this medication only works as long as you are hydrated.  I recommend increasing her fluid intake, increasing fiber intake, and regular exercises as this can help decrease constipation.   also given you a prescription for Bentyl this will help with abdominal spasms please take as needed.  Given you Zofran for nausea please use as needed.  I would like you to follow-up with urology for further evaluation of your UTIs.  Please follow-up with GI for abdominal pain.  Please call your earliest convenience to set up a follow-up appointment.  Come back to the emergency department if you develop chest pain, shortness of breath, severe abdominal pain, uncontrolled nausea, vomiting, diarrhea.

## 2020-09-29 ENCOUNTER — Other Ambulatory Visit (HOSPITAL_COMMUNITY): Payer: Self-pay | Admitting: Family Medicine

## 2020-09-29 ENCOUNTER — Other Ambulatory Visit: Payer: Self-pay | Admitting: Family Medicine

## 2020-09-29 DIAGNOSIS — R350 Frequency of micturition: Secondary | ICD-10-CM

## 2020-10-02 ENCOUNTER — Other Ambulatory Visit: Payer: Self-pay

## 2020-10-02 ENCOUNTER — Ambulatory Visit (HOSPITAL_COMMUNITY)
Admission: RE | Admit: 2020-10-02 | Discharge: 2020-10-02 | Disposition: A | Discharge status: Home / Self Care | Payer: No Typology Code available for payment source | Source: Ambulatory Visit | Attending: Family Medicine | Admitting: Family Medicine

## 2020-10-02 DIAGNOSIS — R350 Frequency of micturition: Secondary | ICD-10-CM | POA: Diagnosis present

## 2020-10-26 ENCOUNTER — Encounter: Payer: Self-pay | Admitting: Cardiology

## 2020-11-10 ENCOUNTER — Ambulatory Visit (INDEPENDENT_AMBULATORY_CARE_PROVIDER_SITE_OTHER): Payer: No Typology Code available for payment source | Admitting: Urology

## 2020-11-10 ENCOUNTER — Other Ambulatory Visit: Payer: Self-pay

## 2020-11-10 ENCOUNTER — Encounter: Payer: Self-pay | Admitting: Urology

## 2020-11-10 VITALS — BP 128/81 | HR 74 | Temp 98.7°F

## 2020-11-10 DIAGNOSIS — N3281 Overactive bladder: Secondary | ICD-10-CM

## 2020-11-10 DIAGNOSIS — N390 Urinary tract infection, site not specified: Secondary | ICD-10-CM | POA: Diagnosis not present

## 2020-11-10 LAB — URINALYSIS, ROUTINE W REFLEX MICROSCOPIC
Bilirubin, UA: NEGATIVE
Glucose, UA: NEGATIVE
Ketones, UA: NEGATIVE
Leukocytes,UA: NEGATIVE
Nitrite, UA: NEGATIVE
Protein,UA: NEGATIVE
RBC, UA: NEGATIVE
Specific Gravity, UA: <1.025 (ref 1.005–1.030)
Urobilinogen, Ur: 0.2 mg/dL (ref 0.2–1.0)
pH, UA: 6.5 (ref 5.0–7.5)

## 2020-11-10 LAB — BLADDER SCAN AMB NON-IMAGING: Scan Result: 45

## 2020-11-10 MED ORDER — MIRABEGRON ER 25 MG PO TB24: 25.0000 mg | ORAL_TABLET | Freq: Every day | ORAL | 0 refills | Status: DC

## 2020-11-10 NOTE — Patient Instructions (Signed)

## 2020-11-10 NOTE — Progress Notes (Signed)
11/10/2020 2:30 PM   Robin Stout 02/04/1976 [id number]  Referring provider: Benita Stabile, MD [address] [place],  [place] [address]  Frequent UTIs   HPI: Robin Stout is a 44yo here for evaluation of frequent UTI. Starting Dec 2021 she developed urinary urgency and frequency and was diagnosed with a UTI. She was then diagnosed with a UTI in February and then May. Symptoms are urinary urgency, frequency and pelvic pain. Symptoms improve with antibiotics. She also has new urgency and severe pain related to urinary urgency.    PMH: Past Medical History:  Diagnosis Date   Liver lesion    pt says she is not aware   Migraines    Sigmoid diverticulum    pt denies    Surgical History: Past Surgical History:  Procedure Laterality Date   ABDOMINAL HYSTERECTOMY     abdominal plasty  03/2015   APPENDECTOMY     CESAREAN SECTION      Home Medications:  Allergies as of 11/10/2020       Reactions   Penicillins Swelling   Inderal [propranolol]    Severe fatigue        Medication List        Accurate as of November 10, 2020  2:30 PM. If you have any questions, ask your nurse or doctor.          ADVIL MULTI-SYMPTOM COLD PO Take 2 tablets by mouth daily as needed (pain).   azithromycin 250 MG tablet Commonly known as: ZITHROMAX Take 1 tablet (250 mg total) by mouth daily. Take first 2 tablets together, then 1 every day until finished.   Cetirizine HCl 10 MG Caps Take 1 capsule (10 mg total) by mouth daily for 10 days.   dicyclomine 20 MG tablet Commonly known as: BENTYL Take 1 tablet (20 mg total) by mouth 2 (two) times daily.   Erenumab-aooe 70 MG/ML Soaj Commonly known as: Aimovig Inject 70 mg into the skin every 30 (thirty) days.   escitalopram 10 MG tablet Commonly known as: Lexapro Take 2 tablets (20 mg total) by mouth at bedtime.   fluticasone 50 MCG/ACT nasal spray Commonly known as: FLONASE Place 1 spray into both nostrils daily for 14  days.   ibuprofen 800 MG tablet Commonly known as: ADVIL Take 1 tablet (800 mg total) by mouth 3 (three) times daily.   methylphenidate 10 MG tablet Commonly known as: Ritalin Take 0.5-1 tablets (5-10 mg total) by mouth 3 (three) times daily.   ondansetron 4 MG tablet Commonly known as: ZOFRAN Take 1 tablet (4 mg total) by mouth every 6 (six) hours.   phenazopyridine 200 MG tablet Commonly known as: PYRIDIUM Take 200 mg by mouth 3 (three) times daily.   promethazine 25 MG tablet Commonly known as: PHENERGAN Take 1 tablet (25 mg total) by mouth every 8 (eight) hours as needed for nausea or vomiting.   simvastatin 10 MG tablet Commonly known as: ZOCOR Take 10 mg by mouth at bedtime.   sulfamethoxazole-trimethoprim 800-160 MG tablet Commonly known as: BACTRIM DS SMARTSIG:1 Tablet(s) By Mouth Every 12 Hours   SUMAtriptan 100 MG tablet Commonly known as: IMITREX TAKE 1 TABLET BY MOUTH AS NEEDED FOR MIGRAINE IF HEADACHE PERSIST OR RECURS MAY TAKE 1 TABLET 2 HOURS LATER        Allergies:  Allergies  Allergen Reactions   Penicillins Swelling   Inderal [Propranolol]     Severe fatigue    Family History: Family History  Problem Relation Age of Onset   Multiple sclerosis Mother    Diabetes Father    Heart disease Father    Hypertension Father    Stroke Father     Social History:  reports that she has never smoked. She has never used smokeless tobacco. She reports current alcohol use. She reports that she does not use drugs.  ROS: All other review of systems were reviewed and are negative except what is noted above in HPI  Physical Exam: BP 128/81   Pulse 74   Temp 98.7 F (37.1 C)   Constitutional:  Alert and oriented, No acute distress. HEENT: [place] AT, moist mucus membranes.  Trachea midline, no masses. Cardiovascular: No clubbing, cyanosis, or edema. Respiratory: Normal respiratory effort, no increased work of breathing. GI: Abdomen is soft, nontender,  nondistended, no abdominal masses GU: No CVA tenderness.  Lymph: No cervical or inguinal lymphadenopathy. Skin: No rashes, bruises or suspicious lesions. Neurologic: Grossly intact, no focal deficits, moving all 4 extremities. Psychiatric: Normal mood and affect.  Laboratory Data: Lab Results  Component Value Date   WBC 5.3 09/24/2020   HGB 13.7 09/24/2020   HCT 43.0 09/24/2020   MCV 85.1 09/24/2020   PLT 219 09/24/2020    Lab Results  Component Value Date   CREATININE 0.70 09/24/2020    No results found for: PSA  No results found for: TESTOSTERONE  No results found for: HGBA1C  Urinalysis    Component Value Date/Time   COLORURINE YELLOW 09/24/2020 1902   APPEARANCEUR HAZY (A) 09/24/2020 1902   LABSPEC 1.025 09/24/2020 1902   PHURINE 6.0 09/24/2020 1902   GLUCOSEU NEGATIVE 09/24/2020 1902   HGBUR NEGATIVE 09/24/2020 1902   BILIRUBINUR NEGATIVE 09/24/2020 1902   KETONESUR 5 (A) 09/24/2020 1902   PROTEINUR NEGATIVE 09/24/2020 1902   UROBILINOGEN 0.2 07/23/2008 1100   NITRITE NEGATIVE 09/24/2020 1902   LEUKOCYTESUR NEGATIVE 09/24/2020 1902    Lab Results  Component Value Date   BACTERIA FEW (A) 07/13/2008    Pertinent Imaging:  No results found for this or any previous visit.  No results found for this or any previous visit.  No results found for this or any previous visit.  No results found for this or any previous visit.  Results for orders placed during the hospital encounter of 10/02/20  US RENAL  Narrative CLINICAL DATA:  Increased urinary frequency.  Question obstruction.  EXAM: RENAL / URINARY TRACT ULTRASOUND COMPLETE  COMPARISON:  CT of the abdomen pelvis 08/2920  FINDINGS: Right Kidney:  Renal measurements: 12.3 x 4.9 x 6.2 cm = volume: 196.5 mL. Echogenicity within normal limits. No mass or hydronephrosis visualized.  Left Kidney:  Renal measurements: 10.3 x 5.9 x 6.1 = volume: 94.2 mL. Echogenicity within normal limits. No mass  or hydronephrosis visualized.  Bladder:  Appears normal for degree of bladder distention. Right ureteral jet visualized. No left ureteral jet visualized.  Other:  None.  IMPRESSION: Normal bilateral renal ultrasound.   Electronically Signed By: Marin Roberts M.D. On: 10/02/2020 16:08  No results found for this or any previous visit.  No results found for this or any previous visit.  No results found for this or any previous visit.   Assessment & Plan:    1. Recurrent UTI --We discussed the natural hx of recurrent UTIs and the various causes. We discussed the treatment options including post coital prophylaxis, daily prophylaxis, topical estrogen therapy. -We will start cranberry supplement, and probiotic - Urinalysis, Routine w reflex  microscopic - BLADDER SCAN AMB NON-IMAGING  2. Urinary urgency -We will trial mirabegron 25mg     No follow-ups on file.  , MD  [facility] [place]

## 2020-11-10 NOTE — Progress Notes (Signed)
Urological Symptom Review  Patient is experiencing the following symptoms: Frequent urination Hard to postpone urination Get up at night to urinate Blood in urine   Review of Systems  Gastrointestinal (upper)  : Negative for upper GI symptoms  Gastrointestinal (lower) : Negative for lower GI symptoms  Constitutional : Fatigue  Skin: Negative for skin symptoms  Eyes: Negative for eye symptoms  Ear/Nose/Throat : Negative for Ear/Nose/Throat symptoms  Hematologic/Lymphatic: Negative for Hematologic/Lymphatic symptoms  Cardiovascular : Chest pain  Respiratory : Negative for respiratory symptoms  Endocrine: Excessive thirst  Musculoskeletal: Negative for musculoskeletal symptoms  Neurological: Headaches  Psychologic: Negative for psychiatric symptoms

## 2020-11-10 NOTE — Progress Notes (Signed)
post void residual=45 ?

## 2020-12-21 ENCOUNTER — Encounter: Payer: Self-pay | Admitting: *Deleted

## 2020-12-22 ENCOUNTER — Other Ambulatory Visit: Payer: Self-pay

## 2020-12-22 ENCOUNTER — Encounter: Payer: Self-pay | Admitting: Cardiology

## 2020-12-22 ENCOUNTER — Encounter: Payer: Self-pay | Admitting: *Deleted

## 2020-12-22 ENCOUNTER — Other Ambulatory Visit: Payer: Self-pay | Admitting: Cardiology

## 2020-12-22 ENCOUNTER — Telehealth: Payer: Self-pay | Admitting: Cardiology

## 2020-12-22 ENCOUNTER — Ambulatory Visit (INDEPENDENT_AMBULATORY_CARE_PROVIDER_SITE_OTHER): Payer: No Typology Code available for payment source | Admitting: Cardiology

## 2020-12-22 ENCOUNTER — Ambulatory Visit (INDEPENDENT_AMBULATORY_CARE_PROVIDER_SITE_OTHER): Payer: No Typology Code available for payment source

## 2020-12-22 VITALS — BP 122/84 | HR 86 | Ht 62.0 in | Wt 207.0 lb

## 2020-12-22 DIAGNOSIS — R011 Cardiac murmur, unspecified: Secondary | ICD-10-CM

## 2020-12-22 DIAGNOSIS — R002 Palpitations: Secondary | ICD-10-CM | POA: Diagnosis not present

## 2020-12-22 DIAGNOSIS — R0789 Other chest pain: Secondary | ICD-10-CM | POA: Diagnosis not present

## 2020-12-22 NOTE — Telephone Encounter (Signed)
Checking percert on the following patient for testing scheduled at [facility].     2 D Echo dx: heart murmur  GXT dx: atypical chest pain & family history of CAD  3 DAY ZIO XT dx: palpitations

## 2020-12-22 NOTE — Patient Instructions (Signed)
Medication Instructions:  Your physician recommends that you continue on your current medications as directed. Please refer to the Current Medication list given to you today.  Labwork: none  Testing/Procedures: Your physician has requested that you have an echocardiogram. Echocardiography is a painless test that uses sound waves to create images of your heart. It provides your doctor with information about the size and shape of your heart and how well your heart's chambers and valves are working. This procedure takes approximately one hour. There are no restrictions for this procedure. Your physician has requested that you have an exercise tolerance test. For further information please visit [email or web address] Please also follow instruction sheet, as given. ZIO- Long Term Monitor Instructions   Your physician has requested you wear your ZIO patch monitor 3 days.   This is a single patch monitor.  [employer] supplies one patch monitor per enrollment.  Additional stickers are not available.   Please do not apply patch if you will be having a Nuclear Stress Test, Echocardiogram, Cardiac CT, MRI, or Chest Xray during the time frame you would be wearing the monitor. The patch cannot be worn during these tests.  You cannot remove and re-apply the ZIO XT patch monitor.     Once you have received you monitor, please review enclosed instructions.  Your monitor has already been registered assigning a specific monitor serial # to you.   Applying the monitor   Shave hair from upper left chest.   Hold abrader disc by orange tab.  Rub abrader in 40 strokes over left upper chest as indicated in your monitor instructions.   Clean area with 4 enclosed alcohol pads .  Use all pads to assure are is cleaned thoroughly.  Let dry.   Apply patch as indicated in monitor instructions.  Patch will be place under collarbone on left side of chest with arrow pointing upward.   Rub patch adhesive wings for 2  minutes.Remove white label marked "1".  Remove white label marked "2".  Rub patch adhesive wings for 2 additional minutes.   While looking in a mirror, press and release button in center of patch.  A small green light will flash 3-4 times .  This will be your only indicator the monitor has been turned on.     Do not shower for the first 24 hours.  You may shower after the first 24 hours.   Press button if you feel a symptom. You will hear a small click.  Record Date, Time and Symptom in the Patient Log Book.   When you are ready to remove patch, follow instructions on last 2 pages of Patient Log Book.  Stick patch monitor onto last page of Patient Log Book.   Place Patient Log Book in [place] box.  Use locking tab on box and tape box closed securely.  The [employer] and [employer] has [employer] on it.  Please place in mailbox as soon as possible.  Your physician should have your test results approximately 7 days after the monitor has been mailed back to [facility]   Call [facility] Customer Care at [phone] if you have questions regarding your ZIO XT patch monitor.  Call them immediately if you see an orange light blinking on your monitor.   If your monitor falls off in less than 4 days contact our Monitor department at [phone].  If your monitor becomes loose or falls off after 4 days call [employer] at [phone] for suggestions on securing your monitor.  Follow-Up: Your physician recommends that you schedule a follow-up appointment in: pending  Any Other Special Instructions Will Be Listed Below (If Applicable).  If you need a refill on your cardiac medications before your next appointment, please call your pharmacy.

## 2020-12-22 NOTE — Addendum Note (Signed)
Addended by: Leonides Schanz C on: 12/22/2020 10:05 AM   Modules accepted: Orders

## 2020-12-22 NOTE — Progress Notes (Signed)
Cardiology Office Note  Date: 12/22/2020   ID: [id number] Dorothea Glassman 1975-12-04, MRN [id number]  PCP:  Benita Stabile, MD  Cardiologist:  Nona Dell, MD Electrophysiologist:  None   Chief Complaint  Patient presents with   Chest Pain   Palpitations     History of Present Illness: [facility] Mayford Knife is a 45 y.o. female referred for cardiology consultation by Dr. Margo Aye, provided records were limited.  She is here today with her fianc.  I reviewed her chart including ER visit and subsequent visit by Dr. Ronne Binning for recurrent UTIs.  She was seen in the ER back in May with abdominal discomfort.  States that she became concerned that her cardiac telemetry monitor was indicating slow heart rate intermittently.  Since that time she indicates that her Samsung watch has reported episodes of atrial fibrillation.  I reviewed 2 of the strips in question, neither of which showed atrial fibrillation, she was in sinus rhythm with occasional PACs.  She does describe a general sense of palpitations, sometimes vibration or fast heartbeat in her neck.  She has also been experiencing a mild low-level chest discomfort that she has a harder time describing.  She is worried because her father developed heart disease at a younger age and also had atrial fibrillation.  She was seen by her PCP and had an ECG done, was told that she had" arrhythmia" however I personally reviewed the tracing which shows sinus rhythm and sinus arrhythmia which is normal.  She indicates that her symptoms are daily.  She has had no associated syncope.  I reviewed her medications which are listed below.  I personally reviewed a repeat ECG done today which shows normal sinus rhythm with slight sinus arrhythmia, again a normal finding.   Past Medical History:  Diagnosis Date   Migraines    Mixed hyperlipidemia    Recurrent UTI     Past Surgical History:  Procedure Laterality Date   ABDOMINAL HYSTERECTOMY      ABDOMINOPLASTY  2016   APPENDECTOMY     CESAREAN SECTION      Current Outpatient Medications  Medication Sig Dispense Refill   AIMOVIG 140 MG/ML SOAJ Inject into the skin.     mirabegron ER (MYRBETRIQ) 25 MG TB24 tablet Take 1 tablet (25 mg total) by mouth daily. 30 tablet 0   ondansetron (ZOFRAN) 4 MG tablet Take 1 tablet (4 mg total) by mouth every 6 (six) hours. 12 tablet 0   simvastatin (ZOCOR) 10 MG tablet Take 10 mg by mouth at bedtime.     SUMAtriptan (IMITREX) 100 MG tablet TAKE 1 TABLET BY MOUTH AS NEEDED FOR MIGRAINE IF HEADACHE PERSIST OR RECURS MAY TAKE 1 TABLET 2 HOURS LATER 10 tablet 5   No current facility-administered medications for this visit.   Allergies:  Penicillins and Inderal [propranolol]   Social History: The patient  reports that she has never smoked. She has never used smokeless tobacco. She reports current alcohol use. She reports that she does not use drugs.   Family History: The patient's family history includes Diabetes in her father; Heart disease in her father; Hypertension in her father; Multiple sclerosis in her mother; Stroke in her father.   ROS: No orthopnea or PND.  Physical Exam: VS:  BP 122/84   Pulse 86   Ht 5\' 2"  (1.575 m)   Wt 207 lb (93.9 kg)   SpO2 97%   BMI 37.86 kg/m , BMI Body mass index is 37.86  kg/m.  Wt Readings from Last 3 Encounters:  12/22/20 207 lb (93.9 kg)  09/24/20 210 lb (95.3 kg)  07/15/19 225 lb (102.1 kg)    General: Patient appears comfortable at rest. HEENT: Conjunctiva and lids normal, wearing a mask. Neck: Supple, no elevated JVP or carotid bruits, no thyromegaly. Lungs: Clear to auscultation, nonlabored breathing at rest. Cardiac: Regular rate and rhythm, no S3, 2/6 systolic murmur, no pericardial rub. Abdomen: Soft, nontender, bowel sounds present. Extremities: No pitting edema, distal pulses 2+. Skin: Warm and dry. Musculoskeletal: No kyphosis. Neuropsychiatric: Alert and oriented x3, affect grossly  appropriate.  ECG:  An ECG dated 01/10/2011 was personally reviewed today and demonstrated:  Sinus rhythm.  Recent Labwork: 09/24/2020: ALT 10; AST 16; BUN 14; Creatinine, Ser 0.70; Hemoglobin 13.7; Platelets 219; Potassium 4.0; Sodium 135  June 2022: Cholesterol 204, triglycerides 99, HDL 48, LDL 138, BUN 12, creatinine 0.77, potassium 4.6, AST 11, ALT 7, hemoglobin 13.4, platelets 206  Other Studies Reviewed Today:  Abdominal and pelvic CT 09/24/2020: FINDINGS: Evaluation of this exam is limited in the absence of intravenous contrast.   Lower chest: The visualized lung bases are clear.   No intra-abdominal free air or free fluid.   Hepatobiliary: No focal liver abnormality is seen. No gallstones, gallbladder wall thickening, or biliary dilatation.   Pancreas: There is mild peripancreatic haziness. Correlation with pancreatic enzymes recommended to evaluate for acute pancreatitis.   Spleen: Normal in size without focal abnormality.   Adrenals/Urinary Tract: The adrenal glands unremarkable. The kidneys, visualized ureters, and urinary bladder appear unremarkable.   Stomach/Bowel: There is moderate stool throughout the colon. There is no bowel obstruction or active inflammation. Appendectomy.   Vascular/Lymphatic: The abdominal aorta and IVC are unremarkable. No portal venous gas. There is no adenopathy.   Reproductive: Hysterectomy. No adnexal masses.   Other: Midline vertical anterior pelvic wall incisional scar.   Musculoskeletal: No acute or significant osseous findings.   IMPRESSION: 1. Possible acute pancreatitis. Correlation with pancreatic enzymes recommended 2. Moderate colonic stool burden. No bowel obstruction.  Renal ultrasound 10/02/2020: IMPRESSION: Normal bilateral renal ultrasound.  Assessment and Plan:  1.  Sense of palpitations as discussed above.  So far, no clearly documented arrhythmia based on review of recent ECGs and also to tracings from her  Samsung watch.  She does have PACs occasionally, also sinus arrhythmia which is a normal finding.  We discussed obtaining a 72-hour Zio patch to better investigate cardiac rhythm.  I suspect that the reported bradycardia noted during her ER visit was potentially related to increased vagal tone in the setting of abdominal discomfort.  We will however get a better sense of her true heart rate variability with the cardiac monitor.  2.  Atypical chest pain.  She does have a family history of premature CAD in her father.  She is on Zocor for treatment of mild hyperlipidemia, recent LDL 138.  We will obtain a GXT for ischemic surveillance.  3.  Cardiac murmur, possibly flow murmur although this has not been previously investigated per review of the chart.  We will obtain an echocardiogram to ensure structurally normal heart and no significant valvular abnormalities.  Medication Adjustments/Labs and Tests Ordered: Current medicines are reviewed at length with the patient today.  Concerns regarding medicines are outlined above.   Tests Ordered: Orders Placed This Encounter  Procedures   EXERCISE TOLERANCE TEST (ETT)   ECHOCARDIOGRAM COMPLETE     Medication Changes: No orders of the defined types were placed  in this encounter.   Disposition:  Follow up  test results.  Signed, Jonelle Sidle, MD, [facility] 12/22/2020 9:58 AM    [facility] [address] [place], [place], [place] [address] Phone: [phone]; Fax: [phone]

## 2020-12-26 ENCOUNTER — Encounter: Payer: Self-pay | Admitting: Urology

## 2020-12-26 ENCOUNTER — Other Ambulatory Visit (HOSPITAL_COMMUNITY): Payer: Self-pay | Admitting: Internal Medicine

## 2020-12-26 ENCOUNTER — Telehealth (INDEPENDENT_AMBULATORY_CARE_PROVIDER_SITE_OTHER): Payer: No Typology Code available for payment source | Admitting: Urology

## 2020-12-26 ENCOUNTER — Other Ambulatory Visit: Payer: Self-pay

## 2020-12-26 DIAGNOSIS — N3281 Overactive bladder: Secondary | ICD-10-CM | POA: Diagnosis not present

## 2020-12-26 DIAGNOSIS — N390 Urinary tract infection, site not specified: Secondary | ICD-10-CM | POA: Diagnosis not present

## 2020-12-26 DIAGNOSIS — Z1231 Encounter for screening mammogram for malignant neoplasm of breast: Secondary | ICD-10-CM

## 2020-12-26 MED ORDER — MIRABEGRON ER 25 MG PO TB24
25.0000 mg | ORAL_TABLET | Freq: Every day | ORAL | 0 refills | Status: DC
Start: 2020-12-26 — End: 2021-01-15

## 2020-12-26 NOTE — Progress Notes (Signed)
12/26/2020 4:21 PM   Robin Stout [birth year] [id number]  Referring provider: Benita Stabile, MD [address] [place],  [place] [address]  Patient location: home Physician location: office I connected with  [facility] on 12/26/20 by a video enabled telemedicine application and verified that I am speaking with the correct person using two identifiers.   I discussed the limitations of evaluation and management by telemedicine. The patient expressed understanding and agreed to proceed.    Followup urinary urgency and recurrent UTI  HPI: Robin Stout is a 45yo here for followup for recurrent UTI and urinary urgency. Since starting mirabegron 25mg  daily she has noted a significant improvement in her urgency and pelvic pressure. She is very happy with her response to mirabegron. No UTIs since last visit. No other complaints today.    PMH: Past Medical History:  Diagnosis Date   Migraines    Mixed hyperlipidemia    Recurrent UTI     Surgical History: Past Surgical History:  Procedure Laterality Date   ABDOMINAL HYSTERECTOMY     ABDOMINOPLASTY  2016   APPENDECTOMY     CESAREAN SECTION      Home Medications:  Allergies as of 12/26/2020       Reactions   Penicillins Swelling   Inderal [propranolol]    Severe fatigue        Medication List        Accurate as of December 26, 2020  4:21 PM. If you have any questions, ask your nurse or doctor.          Aimovig 140 MG/ML Soaj Generic drug: Erenumab-aooe Inject into the skin.   mirabegron ER 25 MG Tb24 tablet Commonly known as: MYRBETRIQ Take 1 tablet (25 mg total) by mouth daily.   ondansetron 4 MG tablet Commonly known as: ZOFRAN Take 1 tablet (4 mg total) by mouth every 6 (six) hours.   simvastatin 10 MG tablet Commonly known as: ZOCOR Take 10 mg by mouth at bedtime.   SUMAtriptan 100 MG tablet Commonly known as: IMITREX TAKE 1 TABLET BY MOUTH AS NEEDED FOR MIGRAINE IF HEADACHE PERSIST  OR RECURS MAY TAKE 1 TABLET 2 HOURS LATER        Allergies:  Allergies  Allergen Reactions   Penicillins Swelling   Inderal [Propranolol]     Severe fatigue    Family History: Family History  Problem Relation Age of Onset   Multiple sclerosis Mother    Diabetes Father    Heart disease Father    Hypertension Father    Stroke Father     Social History:  reports that she has never smoked. She has never used smokeless tobacco. She reports current alcohol use. She reports that she does not use drugs.  ROS: All other review of systems were reviewed and are negative except what is noted above in HPI   Laboratory Data: Lab Results  Component Value Date   WBC 5.3 09/24/2020   HGB 13.7 09/24/2020   HCT 43.0 09/24/2020   MCV 85.1 09/24/2020   PLT 219 09/24/2020    Lab Results  Component Value Date   CREATININE 0.70 09/24/2020    No results found for: PSA  No results found for: TESTOSTERONE  No results found for: HGBA1C  Urinalysis    Component Value Date/Time   COLORURINE YELLOW 09/24/2020 1902   APPEARANCEUR Clear 11/10/2020 1429   LABSPEC 1.025 09/24/2020 1902   PHURINE 6.0 09/24/2020 1902   GLUCOSEU Negative 11/10/2020  1429   HGBUR NEGATIVE 09/24/2020 1902   BILIRUBINUR Negative 11/10/2020 1429   KETONESUR 5 (A) 09/24/2020 1902   PROTEINUR Negative 11/10/2020 1429   PROTEINUR NEGATIVE 09/24/2020 1902   UROBILINOGEN 0.2 07/23/2008 1100   NITRITE Negative 11/10/2020 1429   NITRITE NEGATIVE 09/24/2020 1902   LEUKOCYTESUR Negative 11/10/2020 1429   LEUKOCYTESUR NEGATIVE 09/24/2020 1902    Lab Results  Component Value Date   LABMICR Comment 11/10/2020   BACTERIA FEW (A) 07/13/2008    Pertinent Imaging:  No results found for this or any previous visit.  No results found for this or any previous visit.  No results found for this or any previous visit.  No results found for this or any previous visit.  Results for orders placed during the  hospital encounter of 10/02/20  US RENAL  Narrative CLINICAL DATA:  Increased urinary frequency.  Question obstruction.  EXAM: RENAL / URINARY TRACT ULTRASOUND COMPLETE  COMPARISON:  CT of the abdomen pelvis 08/2920  FINDINGS: Right Kidney:  Renal measurements: 12.3 x 4.9 x 6.2 cm = volume: 196.5 mL. Echogenicity within normal limits. No mass or hydronephrosis visualized.  Left Kidney:  Renal measurements: 10.3 x 5.9 x 6.1 = volume: 94.2 mL. Echogenicity within normal limits. No mass or hydronephrosis visualized.  Bladder:  Appears normal for degree of bladder distention. Right ureteral jet visualized. No left ureteral jet visualized.  Other:  None.  IMPRESSION: Normal bilateral renal ultrasound.   Electronically Signed By: Marin Roberts M.D. On: 10/02/2020 16:08  No results found for this or any previous visit.  No results found for this or any previous visit.  No results found for this or any previous visit.   Assessment & Plan:    1. OAB (overactive bladder) -Continue mirabegron 25mg  daily  2. Recurrent UTI -continue cranberry supplement and probiotic   No follow-ups on file.  , MD  [facility] [place]

## 2020-12-26 NOTE — Patient Instructions (Signed)

## 2021-01-03 ENCOUNTER — Other Ambulatory Visit: Payer: Self-pay

## 2021-01-03 ENCOUNTER — Ambulatory Visit (HOSPITAL_BASED_OUTPATIENT_CLINIC_OR_DEPARTMENT_OTHER)
Admission: RE | Admit: 2021-01-03 | Discharge: 2021-01-03 | Disposition: A | Discharge status: Home / Self Care | Payer: No Typology Code available for payment source | Source: Ambulatory Visit | Attending: Cardiology | Admitting: Cardiology

## 2021-01-03 ENCOUNTER — Telehealth: Payer: Self-pay | Admitting: *Deleted

## 2021-01-03 ENCOUNTER — Ambulatory Visit (HOSPITAL_COMMUNITY)
Admission: RE | Admit: 2021-01-03 | Discharge: 2021-01-03 | Disposition: A | Discharge status: Home / Self Care | Payer: No Typology Code available for payment source | Source: Ambulatory Visit | Attending: Cardiology | Admitting: Cardiology

## 2021-01-03 DIAGNOSIS — R0789 Other chest pain: Secondary | ICD-10-CM | POA: Insufficient documentation

## 2021-01-03 DIAGNOSIS — R011 Cardiac murmur, unspecified: Secondary | ICD-10-CM

## 2021-01-03 LAB — ECHOCARDIOGRAM COMPLETE
AR max vel: 1.44 cm2
AV Area VTI: 1.42 cm2
AV Area mean vel: 1.53 cm2
AV Mean grad: 5.9 mmHg
AV Peak grad: 11.9 mmHg
Ao pk vel: 1.73 m/s
Area-P 1/2: 3.27 cm2
S' Lateral: 2.8 cm

## 2021-01-03 NOTE — Progress Notes (Signed)
*  PRELIMINARY RESULTS* Echocardiogram 2D Echocardiogram has been performed.  Stacey Drain 01/03/2021, 11:36 AM

## 2021-01-03 NOTE — Telephone Encounter (Signed)
-----   Message from Jonelle Sidle, MD sent at 01/02/2021 10:22 AM EDT ----- Results reviewed.  Please let her know that a heart monitor was overall reassuring.  Her average heart rate is in the 70s and there were no prolonged episodes of bradycardia.  She had 2 brief episodes of SVT, longest was only 10 beats.  This was not atrial fibrillation and would not necessarily require medical therapy at this point.  Otherwise rare PACs and PVCs which could cause heart rate irregularity, but do not represent rhythm change.

## 2021-01-03 NOTE — Telephone Encounter (Signed)
Patient informed. Copy sent to PCP °

## 2021-01-03 NOTE — Telephone Encounter (Signed)
-----   Message from Jonelle Sidle, MD sent at 01/03/2021  2:19 PM EDT ----- Results reviewed.  Please let her know that cardiac function is normal with LVEF 60 to 65%.  Also no significant valvular abnormalities.  Reassuring findings overall.

## 2021-01-04 ENCOUNTER — Telehealth: Payer: Self-pay | Admitting: *Deleted

## 2021-01-04 LAB — EXERCISE TOLERANCE TEST
Estimated workload: 10.1
Exercise duration (min): 7 min
Exercise duration (sec): 37 s
MPHR: 178 {beats}/min
Peak HR: 164 {beats}/min
Percent HR: 92 %
RPE: 13
Rest HR: 70 {beats}/min
ST Depression (mm): 0 mm

## 2021-01-04 NOTE — Telephone Encounter (Signed)
Patient informed and verbalized understanding of plan. Copy sent to PCP 

## 2021-01-04 NOTE — Telephone Encounter (Signed)
-----   Message from Jonelle Sidle, MD sent at 01/04/2021 11:13 AM EDT ----- Results reviewed.  Reassuring exercise treadmill test, low risk for major adverse cardiac events and no diagnostic ST segment changes.  Would not anticipate further cardiac testing at this time.  Continue efforts at risk factor reduction in light of her family history of heart disease.

## 2021-01-11 ENCOUNTER — Encounter (HOSPITAL_COMMUNITY): Payer: No Typology Code available for payment source

## 2021-01-11 DIAGNOSIS — Z1231 Encounter for screening mammogram for malignant neoplasm of breast: Secondary | ICD-10-CM

## 2021-01-15 ENCOUNTER — Other Ambulatory Visit: Payer: Self-pay

## 2021-01-15 ENCOUNTER — Telehealth: Payer: Self-pay

## 2021-01-15 ENCOUNTER — Ambulatory Visit (HOSPITAL_COMMUNITY)
Admission: RE | Admit: 2021-01-15 | Discharge: 2021-01-15 | Disposition: A | Discharge status: Home / Self Care | Payer: No Typology Code available for payment source | Source: Ambulatory Visit | Attending: Internal Medicine | Admitting: Internal Medicine

## 2021-01-15 DIAGNOSIS — N3281 Overactive bladder: Secondary | ICD-10-CM

## 2021-01-15 DIAGNOSIS — Z1231 Encounter for screening mammogram for malignant neoplasm of breast: Secondary | ICD-10-CM | POA: Diagnosis present

## 2021-01-15 MED ORDER — MIRABEGRON ER 25 MG PO TB24: 25.0000 mg | ORAL_TABLET | Freq: Every day | ORAL | 5 refills | Status: DC

## 2021-01-15 MED ORDER — MIRABEGRON ER 25 MG PO TB24: 25.0000 mg | ORAL_TABLET | Freq: Every day | ORAL | 0 refills | Status: DC

## 2021-01-15 NOTE — Telephone Encounter (Signed)
Patient calling to have Rx called into pharmacy.  Pharmacy states they never received the prescription request. Patient is out of medication.   mirabegron ER (MYRBETRIQ) 25 MG TB24 tablet  Call into:  [facility] - [place], [place] - [address] Phone:  [phone]  Fax:  [phone]     Thanks, Rosey Bath

## 2021-01-15 NOTE — Telephone Encounter (Signed)
Rx sent to pharmacy   

## 2021-01-23 ENCOUNTER — Ambulatory Visit: Payer: PRIVATE HEALTH INSURANCE | Admitting: Gastroenterology

## 2021-04-09 ENCOUNTER — Encounter: Payer: Self-pay | Admitting: Urology

## 2021-04-09 ENCOUNTER — Ambulatory Visit (INDEPENDENT_AMBULATORY_CARE_PROVIDER_SITE_OTHER): Payer: No Typology Code available for payment source | Admitting: Urology

## 2021-04-09 ENCOUNTER — Other Ambulatory Visit: Payer: Self-pay

## 2021-04-09 DIAGNOSIS — N3281 Overactive bladder: Secondary | ICD-10-CM

## 2021-04-09 DIAGNOSIS — N390 Urinary tract infection, site not specified: Secondary | ICD-10-CM | POA: Diagnosis not present

## 2021-04-09 MED ORDER — NITROFURANTOIN MONOHYD MACRO 100 MG PO CAPS: 100.0000 mg | ORAL_CAPSULE | Freq: Two times a day (BID) | ORAL | 5 refills | Status: DC

## 2021-04-09 MED ORDER — FLUCONAZOLE 150 MG PO TABS
150.0000 mg | ORAL_TABLET | Freq: Once | ORAL | 0 refills | Status: DC
Start: 2021-04-09 — End: 2021-05-14

## 2021-04-09 NOTE — Progress Notes (Signed)
04/09/2021 10:07 AM   Robin Stout Jan 19, 1976 [id number]  Referring provider: Benita Stabile, MD [address] [place],  [place] [address]  Patient location: home Physician location: office I connected with  [facility] on 04/10/21 by a video enabled telemedicine application and verified that I am speaking with the correct person using two identifiers.   I discussed the limitations of evaluation and management by telemedicine. The patient expressed understanding and agreed to proceed.    Followup recurrent UTI  HPI: Robin Stout is a 45yo here for followup for recurrent UTIS. Since has 1 UTI since her last visit and it was after sexual intercourse. No significant LUTS currently as long as she is taking mirabegron 25mg  daily. No urinary urgency or frequency. No incontinence.. She takes macrobid prn for UTI. No new complaints today   PMH: Past Medical History:  Diagnosis Date   Migraines    Mixed hyperlipidemia    Recurrent UTI     Surgical History: Past Surgical History:  Procedure Laterality Date   ABDOMINAL HYSTERECTOMY     ABDOMINOPLASTY  2016   APPENDECTOMY     CESAREAN SECTION      Home Medications:  Allergies as of 04/09/2021       Reactions   Penicillins Swelling   Inderal [propranolol]    Severe fatigue        Medication List        Accurate as of April 09, 2021 10:07 AM. If you have any questions, ask your nurse or doctor.          Aimovig 140 MG/ML Soaj Generic drug: Erenumab-aooe Inject into the skin.   mirabegron ER 25 MG Tb24 tablet Commonly known as: MYRBETRIQ Take 1 tablet (25 mg total) by mouth daily.   ondansetron 4 MG tablet Commonly known as: ZOFRAN Take 1 tablet (4 mg total) by mouth every 6 (six) hours.   simvastatin 10 MG tablet Commonly known as: ZOCOR Take 10 mg by mouth at bedtime.   SUMAtriptan 100 MG tablet Commonly known as: IMITREX TAKE 1 TABLET BY MOUTH AS NEEDED FOR MIGRAINE IF HEADACHE  PERSIST OR RECURS MAY TAKE 1 TABLET 2 HOURS LATER        Allergies:  Allergies  Allergen Reactions   Penicillins Swelling   Inderal [Propranolol]     Severe fatigue    Family History: Family History  Problem Relation Age of Onset   Multiple sclerosis Mother    Diabetes Father    Heart disease Father    Hypertension Father    Stroke Father     Social History:  reports that she has never smoked. She has never used smokeless tobacco. She reports current alcohol use. She reports that she does not use drugs.  ROS: All other review of systems were reviewed and are negative except what is noted above in HPI   Laboratory Data: Lab Results  Component Value Date   WBC 5.3 09/24/2020   HGB 13.7 09/24/2020   HCT 43.0 09/24/2020   MCV 85.1 09/24/2020   PLT 219 09/24/2020    Lab Results  Component Value Date   CREATININE 0.70 09/24/2020    No results found for: PSA  No results found for: TESTOSTERONE  No results found for: HGBA1C  Urinalysis    Component Value Date/Time   COLORURINE YELLOW 09/24/2020 1902   APPEARANCEUR Clear 11/10/2020 1429   LABSPEC 1.025 09/24/2020 1902   PHURINE 6.0 09/24/2020 1902   GLUCOSEU Negative  11/10/2020 1429   HGBUR NEGATIVE 09/24/2020 1902   BILIRUBINUR Negative 11/10/2020 1429   KETONESUR 5 (A) 09/24/2020 1902   PROTEINUR Negative 11/10/2020 1429   PROTEINUR NEGATIVE 09/24/2020 1902   UROBILINOGEN 0.2 07/23/2008 1100   NITRITE Negative 11/10/2020 1429   NITRITE NEGATIVE 09/24/2020 1902   LEUKOCYTESUR Negative 11/10/2020 1429   LEUKOCYTESUR NEGATIVE 09/24/2020 1902    Lab Results  Component Value Date   LABMICR Comment 11/10/2020   BACTERIA FEW (A) 07/13/2008    Pertinent Imaging:  No results found for this or any previous visit.  No results found for this or any previous visit.  No results found for this or any previous visit.  No results found for this or any previous visit.  Results for orders placed during the  hospital encounter of 10/02/20  US RENAL  Narrative CLINICAL DATA:  Increased urinary frequency.  Question obstruction.  EXAM: RENAL / URINARY TRACT ULTRASOUND COMPLETE  COMPARISON:  CT of the abdomen pelvis 08/2920  FINDINGS: Right Kidney:  Renal measurements: 12.3 x 4.9 x 6.2 cm = volume: 196.5 mL. Echogenicity within normal limits. No mass or hydronephrosis visualized.  Left Kidney:  Renal measurements: 10.3 x 5.9 x 6.1 = volume: 94.2 mL. Echogenicity within normal limits. No mass or hydronephrosis visualized.  Bladder:  Appears normal for degree of bladder distention. Right ureteral jet visualized. No left ureteral jet visualized.  Other:  None.  IMPRESSION: Normal bilateral renal ultrasound.   Electronically Signed By: Marin Roberts M.D. On: 10/02/2020 16:08  No results found for this or any previous visit.  No results found for this or any previous visit.  No results found for this or any previous visit.   Assessment & Plan:    1. OAB (overactive bladder) -continue mirabegron 25mg  daily  2. Recurrent UTI -We discussed the natural hx of recurrent UTIs and the various causes. We discussed the treatment options including post coital prophylaxis, daily prophylaxis, topical estrogen therapy. We will continue cranberry supplement. We will also start post coital macrobid prophylaxis.    No follow-ups on file.  , MD  [facility] [place]

## 2021-04-09 NOTE — Patient Instructions (Signed)

## 2021-04-10 ENCOUNTER — Telehealth: Payer: No Typology Code available for payment source | Admitting: Urology

## 2021-05-12 ENCOUNTER — Other Ambulatory Visit: Payer: Self-pay | Admitting: Urology

## 2021-10-04 ENCOUNTER — Other Ambulatory Visit (HOSPITAL_COMMUNITY): Payer: Self-pay | Admitting: Family Medicine

## 2021-10-04 ENCOUNTER — Ambulatory Visit (HOSPITAL_COMMUNITY)
Admission: RE | Admit: 2021-10-04 | Discharge: 2021-10-04 | Disposition: A | Discharge status: Home / Self Care | Payer: No Typology Code available for payment source | Source: Ambulatory Visit | Attending: Family Medicine | Admitting: Family Medicine

## 2021-10-04 ENCOUNTER — Other Ambulatory Visit: Payer: Self-pay | Admitting: Family Medicine

## 2021-10-04 DIAGNOSIS — G43909 Migraine, unspecified, not intractable, without status migrainosus: Secondary | ICD-10-CM | POA: Insufficient documentation

## 2021-10-12 ENCOUNTER — Ambulatory Visit: Payer: No Typology Code available for payment source | Admitting: Urology

## 2021-10-15 ENCOUNTER — Ambulatory Visit: Payer: No Typology Code available for payment source | Admitting: Urology

## 2021-10-15 DIAGNOSIS — N3281 Overactive bladder: Secondary | ICD-10-CM

## 2021-11-18 ENCOUNTER — Other Ambulatory Visit: Payer: Self-pay | Admitting: Urology

## 2021-11-18 DIAGNOSIS — R309 Painful micturition, unspecified: Secondary | ICD-10-CM

## 2022-01-17 ENCOUNTER — Other Ambulatory Visit (HOSPITAL_COMMUNITY): Payer: Self-pay | Admitting: Family Medicine

## 2022-01-17 DIAGNOSIS — Z1231 Encounter for screening mammogram for malignant neoplasm of breast: Secondary | ICD-10-CM

## 2022-01-21 ENCOUNTER — Ambulatory Visit (HOSPITAL_COMMUNITY)
Admission: RE | Admit: 2022-01-21 | Discharge: 2022-01-21 | Disposition: A | Discharge status: Home / Self Care | Payer: No Typology Code available for payment source | Source: Ambulatory Visit | Attending: Family Medicine | Admitting: Family Medicine

## 2022-01-21 DIAGNOSIS — Z1231 Encounter for screening mammogram for malignant neoplasm of breast: Secondary | ICD-10-CM | POA: Diagnosis not present

## 2022-02-16 ENCOUNTER — Emergency Department (HOSPITAL_COMMUNITY): Payer: No Typology Code available for payment source

## 2022-02-16 ENCOUNTER — Emergency Department (HOSPITAL_COMMUNITY)
Admission: EM | Admit: 2022-02-16 | Discharge: 2022-02-16 | Disposition: A | Discharge status: Home / Self Care | Payer: No Typology Code available for payment source | Attending: Emergency Medicine | Admitting: Emergency Medicine

## 2022-02-16 ENCOUNTER — Other Ambulatory Visit: Payer: Self-pay

## 2022-02-16 ENCOUNTER — Encounter (HOSPITAL_COMMUNITY): Payer: Self-pay

## 2022-02-16 DIAGNOSIS — R110 Nausea: Secondary | ICD-10-CM | POA: Diagnosis not present

## 2022-02-16 DIAGNOSIS — R059 Cough, unspecified: Secondary | ICD-10-CM | POA: Diagnosis not present

## 2022-02-16 DIAGNOSIS — R42 Dizziness and giddiness: Secondary | ICD-10-CM | POA: Insufficient documentation

## 2022-02-16 DIAGNOSIS — R0789 Other chest pain: Secondary | ICD-10-CM | POA: Insufficient documentation

## 2022-02-16 DIAGNOSIS — R519 Headache, unspecified: Secondary | ICD-10-CM | POA: Diagnosis not present

## 2022-02-16 DIAGNOSIS — R079 Chest pain, unspecified: Secondary | ICD-10-CM | POA: Diagnosis present

## 2022-02-16 LAB — BASIC METABOLIC PANEL
Anion gap: 8 (ref 5–15)
BUN: 15 mg/dL (ref 6–20)
CO2: 26 mmol/L (ref 22–32)
Calcium: 9 mg/dL (ref 8.9–10.3)
Chloride: 104 mmol/L (ref 98–111)
Creatinine, Ser: 0.75 mg/dL (ref 0.44–1.00)
GFR, Estimated: >60 mL/min (ref >60)
Glucose, Bld: 157 mg/dL — ABNORMAL HIGH (ref 70–99)
Potassium: 4.1 mmol/L (ref 3.5–5.1)
Sodium: 138 mmol/L (ref 135–145)

## 2022-02-16 LAB — CBC
HCT: 41.2 % (ref 36.0–46.0)
Hemoglobin: 13.9 g/dL (ref 12.0–15.0)
MCH: 28.1 pg (ref 26.0–34.0)
MCHC: 33.7 g/dL (ref 30.0–36.0)
MCV: 83.4 fL (ref 80.0–100.0)
Platelets: 215 10*3/uL (ref 150–400)
RBC: 4.94 MIL/uL (ref 3.87–5.11)
RDW: 14 % (ref 11.5–15.5)
WBC: 7.4 10*3/uL (ref 4.0–10.5)
nRBC: 0 % (ref 0.0–0.2)

## 2022-02-16 LAB — TROPONIN I (HIGH SENSITIVITY)
Troponin I (High Sensitivity): <2 ng/L (ref <18)
Troponin I (High Sensitivity): <2 ng/L (ref <18)

## 2022-02-16 MED ORDER — ONDANSETRON HCL 4 MG/2ML IJ SOLN: 4.0000 mg | Freq: Once | INTRAMUSCULAR | Status: DC

## 2022-02-16 MED ORDER — ONDANSETRON HCL 4 MG PO TABS: 4.0000 mg | ORAL_TABLET | Freq: Three times a day (TID) | ORAL | 0 refills | Status: AC | PRN

## 2022-02-16 MED ORDER — SODIUM CHLORIDE 0.9 % IV BOLUS: 1000.0000 mL | Freq: Once | INTRAVENOUS | Status: DC

## 2022-02-16 MED ORDER — ONDANSETRON 4 MG PO TBDP
4.0000 mg | ORAL_TABLET | Freq: Once | ORAL | Status: AC
Start: 2022-02-16 — End: 2022-02-16
  Administered 2022-02-16: 4 mg via ORAL
  Filled 2022-02-16: qty 1

## 2022-02-16 NOTE — ED Provider Notes (Signed)
[facility] EMERGENCY DEPARTMENT Provider Note   CSN: [id number] Arrival date & time: 02/16/22  1459     History  Chief Complaint  Patient presents with   Covid Positive   Chest Pain   Dizziness    [facility] Robin Stout is a 46 y.o. female.  The history is provided by the patient, the spouse and medical records. No language interpreter was used.  Chest Pain Associated symptoms: dizziness   Dizziness Associated symptoms: chest pain      46 year old female significant history of recurrent UTI, migraine, hyperlipidemia, SVT, presenting with complaints of chest pain.  Patient reports a week ago she developed cold symptoms including dry cough, headache, congestion, and overall not feeling well.  She was seen by her PCP and was diagnosed with COVID.  She was started on Paxlovid for which she took for the past 5 days with improvement of her symptoms however today she reported having heart palpitation, chest discomfort, and overall not feeling well with extreme nausea prompting this ER visit.  Patient mention she was diagnosed with SVT in the past and this felt somewhat similar.  She denies productive cough or hemoptysis she denies lightheadedness or dizziness or diaphoresis.  Home Medications Prior to Admission medications   Medication Sig Start Date End Date Taking? Authorizing Provider  AIMOVIG 140 MG/ML SOAJ Inject into the skin. 12/21/20   [provider]  fluconazole (DIFLUCAN) 150 MG tablet TAKE 1 TABLET BY MOUTH FOR ONE DOSE 05/14/21   Summerlin, Berneice Heinrich, PA-C  mirabegron ER (MYRBETRIQ) 25 MG TB24 tablet Take 1 tablet (25 mg total) by mouth daily. 01/15/21   McKenzie, Candee Furbish, MD  nitrofurantoin, macrocrystal-monohydrate, (MACROBID) 100 MG capsule Take 1 capsule (100 mg total) by mouth every 12 (twelve) hours. 04/09/21   McKenzie, Candee Furbish, MD  ondansetron (ZOFRAN) 4 MG tablet Take 1 tablet (4 mg total) by mouth every 6 (six) hours. 09/24/20   Marcello Fennel,  PA-C  phenazopyridine (PYRIDIUM) 200 MG tablet TAKE 1 TABLET BY MOUTH THREE TIMES DAILY FOR THREE DAYS 11/20/21   Cleon Gustin, MD  simvastatin (ZOCOR) 10 MG tablet Take 10 mg by mouth at bedtime. 08/29/20   [provider]  SUMAtriptan (IMITREX) 100 MG tablet TAKE 1 TABLET BY MOUTH AS NEEDED FOR MIGRAINE IF HEADACHE PERSIST OR RECURS MAY TAKE 1 TABLET 2 HOURS LATER 09/24/19   Meredith Staggers, MD      Allergies    Penicillins and Inderal [propranolol]    Review of Systems   Review of Systems  Cardiovascular:  Positive for chest pain.  Neurological:  Positive for dizziness.  All other systems reviewed and are negative.   Physical Exam Updated Vital Signs BP (!) 114/96 (BP Location: Right Arm)   Pulse (!) 59   Temp 97.6 F (36.4 C) (Oral)   Resp 18   Ht 5\' 2"  (1.575 m)   Wt 99.8 kg   SpO2 100%   BMI 40.24 kg/m  Physical Exam Vitals and nursing note reviewed.  Constitutional:      General: She is not in acute distress.    Appearance: She is well-developed.  HENT:     Head: Atraumatic.  Eyes:     Conjunctiva/sclera: Conjunctivae normal.  Cardiovascular:     Rate and Rhythm: Normal rate and regular rhythm.  Pulmonary:     Effort: Pulmonary effort is normal.     Breath sounds: No wheezing, rhonchi or rales.  Abdominal:     Palpations: Abdomen  is soft.     Tenderness: There is no abdominal tenderness.  Musculoskeletal:     Cervical back: Neck supple.  Skin:    Findings: No rash.  Neurological:     Mental Status: She is alert.  Psychiatric:        Mood and Affect: Mood normal.     ED Results / Procedures / Treatments   Labs (all labs ordered are listed, but only abnormal results are displayed) Labs Reviewed  CBC  BASIC METABOLIC PANEL  TROPONIN I (HIGH SENSITIVITY)    EKG None  Radiology DG Chest 2 View  Result Date: 02/16/2022 CLINICAL DATA:  Chest pain. Nausea, dizziness. EXAM: CHEST - 2 VIEW COMPARISON:  Remote chest radiograph from  2005 FINDINGS: The cardiomediastinal contours are normal. The lungs are clear. Pulmonary vasculature is normal. No consolidation, pleural effusion, or pneumothorax. No acute osseous abnormalities are seen. IMPRESSION: Negative radiographs of the chest. Electronically Signed   By: Keith Rake M.D.   On: 02/16/2022 16:05    Procedures Procedures    Medications Ordered in ED Medications - No data to display  ED Course/ Medical Decision Making/ A&P                           Medical Decision Making Amount and/or Complexity of Data Reviewed Labs: ordered. Radiology: ordered.   BP (!) 114/96 (BP Location: Right Arm)   Pulse (!) 59   Temp 97.6 F (36.4 C) (Oral)   Resp 18   Ht 5\' 2"  (1.575 m)   Wt 99.8 kg   SpO2 100%   BMI 40.24 kg/m   30:107 PM  46 year old female significant history of recurrent UTI, migraine, hyperlipidemia, SVT, presenting with complaints of chest pain.  Patient reports a week ago she developed cold symptoms including dry cough, headache, congestion, and overall not feeling well.  She was seen by her PCP and was diagnosed with COVID.  She was started on Paxlovid for which she took for the past 5 days with improvement of her symptoms however today she reported having heart palpitation, chest discomfort, and overall not feeling well with extreme nausea prompting this ER visit.  Patient mention she was diagnosed with SVT in the past and this felt somewhat similar.  She denies productive cough or hemoptysis she denies lightheadedness or dizziness or diaphoresis.  On exam patient is well-appearing resting bed comfortably appears to be in no acute discomfort.  Heart and lung sounds normal abdomen is soft nontender she has intact distal pulses throughout.  She is mentating appropriately.  Her vital signs normal and stable.  Labs, EKG, and imaging obtained independently viewed interpreted by me and I agree with radiology interpretation.  EKG without concerning arrhythmia,  normal troponin, normal WBC, normal H&H, electrolyte panels are reassuring, mildly elevated glucose of 157.  No evidence to suggest pericarditis, or SVT, or other acute emergent medical condition identified today.  I gave patient Zofran for nausea.    6:24 PM I discussed finding with the patient.  At this time she is stable to go home with antinausea medication and outpatient follow-up.  I gave patient return precaution.  This patient presents to the ED for concern of chest pain, this involves an extensive number of treatment options, and is a complaint that carries with it a high risk of complications and morbidity.  The differential diagnosis includes acs, pericarditis, pe, gastritis, MSK pain, pna  Co morbidities that complicate the patient  evaluation none Additional history obtained:  Additional history obtained from husband External records from outside source obtained and reviewed including EMR  Lab Tests:  I Ordered, and personally interpreted labs.  The pertinent results include:  as above  Imaging Studies ordered:  I ordered imaging studies including CXR I independently visualized and interpreted imaging which showed normal I agree with the radiologist interpretation  Cardiac Monitoring:  The patient was maintained on a cardiac monitor.  I personally viewed and interpreted the cardiac monitored which showed an underlying rhythm of: NSR  Medicines ordered and prescription drug management:  I ordered medication including zofran  for nausea Reevaluation of the patient after these medicines showed that the patient improved I have reviewed the patients home medicines and have made adjustments as needed  Test Considered: chest CTA  Critical Interventions: antiemetic    Problem List / ED Course: chest pain  nausea  Reevaluation:  After the interventions noted above, I reevaluated the patient and found that they have :improved  Social Determinants of  Health: none  Dispostion:  After consideration of the diagnostic results and the patients response to treatment, I feel that the patent would benefit from outpt f/u.           Final Clinical Impression(s) / ED Diagnoses Final diagnoses:  Atypical chest pain  Nausea    Rx / DC Orders ED Discharge Orders          Ordered    ondansetron (ZOFRAN) 4 MG tablet  Every 8 hours PRN        02/16/22 1826              Domenic Moras, PA-C 02/16/22 1827    Robin Hem, MD 02/17/22 1155

## 2022-02-16 NOTE — Discharge Instructions (Addendum)
You have been evaluated for your symptoms.  Fortunately x-ray of your chest along with your EKG and labs are all within normal limits and are reassuring.  You may take Zofran as needed for nausea.  Follow-up closely with your doctor for further care.  Return if you have any concern.

## 2022-02-16 NOTE — ED Notes (Signed)
Tested positive for Covid Tuesday 3rd time she has had it. Not feel well now c/o nausea.

## 2022-02-16 NOTE — ED Triage Notes (Signed)
Nausea, dizzy, CP that started today. Pt reports palpitations. Hx of SVT

## 2022-07-04 ENCOUNTER — Encounter: Payer: Self-pay | Admitting: Podiatry

## 2022-07-04 ENCOUNTER — Ambulatory Visit: Payer: BC Managed Care – PPO | Admitting: Podiatry

## 2022-07-04 ENCOUNTER — Ambulatory Visit (INDEPENDENT_AMBULATORY_CARE_PROVIDER_SITE_OTHER): Payer: BC Managed Care – PPO

## 2022-07-04 DIAGNOSIS — M76822 Posterior tibial tendinitis, left leg: Secondary | ICD-10-CM

## 2022-07-04 DIAGNOSIS — M79672 Pain in left foot: Secondary | ICD-10-CM

## 2022-07-04 MED ORDER — MELOXICAM 15 MG PO TABS: 15.0000 mg | ORAL_TABLET | Freq: Every day | ORAL | 0 refills | Status: DC

## 2022-07-04 NOTE — Progress Notes (Signed)
  Subjective:  Patient ID: Robin Stout, female    DOB: Feb 02, 1976,   MRN: [id number]  No chief complaint on file.   47 y.o. female presents for concern of left foot pain that has been going on for about a week. Relates the pain is on the inside and foot and ankle. Has not tried any treatment. She is a Pharmacist, hospital.  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  . Denies any other pedal complaints. Denies n/v/f/c.   Past Medical History:  Diagnosis Date   Migraines    Mixed hyperlipidemia    Recurrent UTI     Objective:  Physical Exam: Vascular: DP/PT pulses 2/4 bilateral. CFT <3 seconds. Normal hair growth on digits. No edema.  Skin. No lacerations or abrasions bilateral feet.  Musculoskeletal: MMT 5/5 bilateral lower extremities in DF, PF, Inversion and Eversion. Deceased ROM in DF of ankle joint.  Tender along the course of the PT tendon mostly proximal to the navicular insertion and extending to the medial malleolus. Some pain with PF and pain with inversion. No pain along achilles. Some pain near the medial calcaneal tubercle.  Neurological: Sensation intact to light touch.   Assessment:   1. Posterior tibial tendon dysfunction (PTTD) of left lower extremity   2. Left foot pain [M79.672]      Plan:  Patient was evaluated and treated and all questions answered. X-rays reviewed and discussed with patient. No acute fractures or dislocations noted. Noted enlarged navicular tubercle.  Discussed PTTD diagnosis and treatment options with patient. Stretching exercises discussed and handout dispensed. Prescription for meloxicam provided. Tri-Lock ankle brace dispensed. Discussed if there is no improvement PT/MRI/injection may be an option. Patient to return to clinic in 6 to 8 weeks or sooner if symptoms fail to improve or worsen.   Lorenda Peck, DPM

## 2022-07-04 NOTE — Patient Instructions (Signed)
Posterior Tibial Tendinitis Rehab Ask your health care provider which exercises are safe for you. Do exercises exactly as told by your health care provider and adjust them as directed. It is normal to feel mild stretching, pulling, tightness, or discomfort as you do these exercises. Stop right away if you feel sudden pain or your pain gets worse. Do not begin these exercises until told by your health care provider. Stretching and range-of-motion exercises These exercises warm up your muscles and joints and improve the movement and flexibility in your ankle and foot. These exercises may also help to relieve pain. Standing wall calf stretch, knee straight  Stand with your hands against a wall. Extend your left / right leg behind you, and bend your front knee slightly. If directed, place a folded washcloth under the arch of your foot for support. Point the toes of your back foot slightly inward. Keeping your heels on the floor and your back knee straight, shift your weight toward the wall. Do not allow your back to arch. You should feel a gentle stretch in your upper left / right calf. Hold this position for __________ seconds. Repeat __________ times. Complete this exercise __________ times a day. Standing wall calf stretch, knee bent Stand with your hands against a wall. Extend your left / right leg behind you, and bend your front knee slightly. If directed, place a folded washcloth under the arch of your foot for support. Point the toes of your back foot slightly inward. Unlock your back knee so it is bent. Keep your heels on the floor. You should feel a gentle stretch deep in your lower left / right calf. Hold this position for __________ seconds. Repeat __________ times. Complete this exercise __________ times a day. Strengthening exercises These exercises build strength and endurance in your ankle and foot. Endurance is the ability to use your muscles for a long time, even after they get  tired. Ankle inversion with band Secure one end of a rubber exercise band or tubing to a fixed object, such as a table leg or a pole, that will stay still when the band is pulled. Loop the other end of the band around the middle of your left / right foot. Sit on the floor facing the object with your left / right leg extended. The band or tube should be slightly tense when your foot is relaxed. Leading with your big toe, slowly bring your left / right foot and ankle inward, toward your other foot (inversion). Hold this position for __________ seconds. Slowly return your foot to the starting position. Repeat __________ times. Complete this exercise __________ times a day. Towel curls  Sit in a chair on a non-carpeted surface, and put your feet on the floor. Place a towel in front of your feet. If told by your health care provider, add a __________ weight to the end of the towel. Keeping your heel on the floor, put your left / right foot on the towel. Pull the towel toward you by grabbing the towel with your toes and curling them under. Keep your heel on the floor while you do this. Let your toes relax. Grab the towel with your toes again. Keep going until the towel is completely underneath your foot. Repeat __________ times. Complete this exercise __________ times a day. Balance exercise This exercise improves or maintains your balance. Balance is important in preventing falls. Single leg stand Without wearing shoes, stand near a railing or in a doorway. You may hold   on to the railing or door frame as needed for balance. Stand on your left / right foot. Keep your big toe down on the floor and try to keep your arch lifted. If balancing in this position is too easy, try the exercise with your eyes closed or while standing on a pillow. Hold this position for __________ seconds. Repeat __________ times. Complete this exercise __________ times a day. This information is not intended to replace  advice given to you by your health care provider. Make sure you discuss any questions you have with your health care provider. Document Revised: 08/11/2018 Document Reviewed: 06/17/2018 Elsevier Patient Education  2023 Elsevier Inc.  

## 2022-07-19 ENCOUNTER — Encounter: Payer: Self-pay | Admitting: Podiatry

## 2022-07-19 ENCOUNTER — Ambulatory Visit: Payer: BC Managed Care – PPO | Admitting: Podiatry

## 2022-07-19 DIAGNOSIS — M722 Plantar fascial fibromatosis: Secondary | ICD-10-CM

## 2022-07-19 DIAGNOSIS — M76822 Posterior tibial tendinitis, left leg: Secondary | ICD-10-CM

## 2022-07-19 MED ORDER — TRIAMCINOLONE ACETONIDE 10 MG/ML IJ SUSP
10.0000 mg | Freq: Once | INTRAMUSCULAR | Status: AC
  Administered 2022-07-19: 10 mg

## 2022-07-20 NOTE — Progress Notes (Signed)
Subjective:   Patient ID: Robin Stout, female   DOB: 47 y.o.   MRN: [id number]   HPI Patient presents stating she has developed intense discomfort in the plantar aspect of the left heel and also still has discomfort in the medial ankle which was treated several weeks ago.  States both areas are quite sore but the heel has now become worse   ROS      Objective:  Physical Exam  Neurovascular status intact exquisite discomfort plantar fascia left at the insertion with discomfort also in the posterior tibial tendon complex with swelling of the area.  Good digital perfusion well-oriented x 3     Assessment:  Inflammatory plantar fasciitis left along with posterior tibial tendinitis left inflamed with difficulty weightbearing currently     Plan:  H&P reviewed discussed both conditions sterile prep injected the plantar fascia left 3 mg Kenalog 5 mg Xylocaine and applied air fracture walker to completely immobilize her lower leg which was fitted properly to her lower leg with instructions given on usage.  Reappoint 3 weeks to recheck

## 2022-08-09 ENCOUNTER — Ambulatory Visit: Payer: BC Managed Care – PPO | Admitting: Podiatry

## 2022-08-09 DIAGNOSIS — M76822 Posterior tibial tendinitis, left leg: Secondary | ICD-10-CM

## 2022-08-09 DIAGNOSIS — M722 Plantar fascial fibromatosis: Secondary | ICD-10-CM | POA: Diagnosis not present

## 2022-08-09 MED ORDER — PREDNISONE 10 MG PO TABS: ORAL_TABLET | ORAL | 0 refills | Status: DC

## 2022-08-09 MED ORDER — TRIAMCINOLONE ACETONIDE 10 MG/ML IJ SUSP
10.0000 mg | Freq: Once | INTRAMUSCULAR | Status: AC
  Administered 2022-08-09: 10 mg

## 2022-08-11 NOTE — Progress Notes (Signed)
Subjective:   Patient ID: Robin Stout, female   DOB: 47 y.o.   MRN: [id number]   HPI Patient states her pain has improved some but she still has a lot of pain in the bottom of the left heel and she has flatfeet which she knows are contributory F2 neurovascular status intact with continued exquisite discomfort medial fascial band left at the insertional point tendon calcaneus with fluid buildup around the medial band   ROS      Objective:  Physical Exam  Above-mentioned acute inflammation still noted heel with pes valgus planus condition     Assessment:  Plantar fasciitis left that is remaining stubborn with structural changes to the arch which are contributory     Plan:  H&P reviewed went ahead today did sterile prep and did 1 more injection 3 mg Kenalog 5 mg Xylocaine and then went ahead and casted for functional orthotic devices to lift up the arches and take stress off the heel and arch.  Patient will be seen back when ready

## 2022-08-15 ENCOUNTER — Ambulatory Visit: Payer: BC Managed Care – PPO | Admitting: Podiatry

## 2022-08-27 ENCOUNTER — Telehealth: Payer: Self-pay | Admitting: Podiatry

## 2022-08-27 NOTE — Telephone Encounter (Signed)
Lmom for patient to call back to schedule picking up orthotics    Pending insurance

## 2022-09-05 DIAGNOSIS — R21 Rash and other nonspecific skin eruption: Secondary | ICD-10-CM | POA: Diagnosis not present

## 2022-10-22 DIAGNOSIS — T63481A Toxic effect of venom of other arthropod, accidental (unintentional), initial encounter: Secondary | ICD-10-CM | POA: Diagnosis not present

## 2023-01-08 ENCOUNTER — Other Ambulatory Visit (HOSPITAL_COMMUNITY): Payer: Self-pay | Admitting: Family Medicine

## 2023-01-08 DIAGNOSIS — L298 Other pruritus: Secondary | ICD-10-CM | POA: Diagnosis not present

## 2023-01-08 DIAGNOSIS — R5382 Chronic fatigue, unspecified: Secondary | ICD-10-CM | POA: Diagnosis not present

## 2023-01-08 DIAGNOSIS — L659 Nonscarring hair loss, unspecified: Secondary | ICD-10-CM | POA: Diagnosis not present

## 2023-01-08 DIAGNOSIS — R221 Localized swelling, mass and lump, neck: Secondary | ICD-10-CM | POA: Diagnosis not present

## 2023-01-16 ENCOUNTER — Other Ambulatory Visit (HOSPITAL_COMMUNITY): Payer: Self-pay | Admitting: Family Medicine

## 2023-01-16 DIAGNOSIS — Z1231 Encounter for screening mammogram for malignant neoplasm of breast: Secondary | ICD-10-CM

## 2023-01-17 ENCOUNTER — Other Ambulatory Visit (HOSPITAL_COMMUNITY): Payer: Self-pay | Admitting: Family Medicine

## 2023-01-17 ENCOUNTER — Ambulatory Visit (HOSPITAL_COMMUNITY)
Admission: RE | Admit: 2023-01-17 | Discharge: 2023-01-17 | Disposition: A | Discharge status: Home / Self Care | Payer: BC Managed Care – PPO | Source: Ambulatory Visit | Attending: Family Medicine | Admitting: Family Medicine

## 2023-01-17 DIAGNOSIS — E042 Nontoxic multinodular goiter: Secondary | ICD-10-CM | POA: Diagnosis not present

## 2023-01-17 DIAGNOSIS — R221 Localized swelling, mass and lump, neck: Secondary | ICD-10-CM

## 2023-01-17 DIAGNOSIS — R590 Localized enlarged lymph nodes: Secondary | ICD-10-CM | POA: Diagnosis not present

## 2023-01-23 ENCOUNTER — Other Ambulatory Visit: Payer: Self-pay | Admitting: Family Medicine

## 2023-01-23 DIAGNOSIS — E041 Nontoxic single thyroid nodule: Secondary | ICD-10-CM

## 2023-02-03 ENCOUNTER — Ambulatory Visit (HOSPITAL_COMMUNITY)
Admission: RE | Admit: 2023-02-03 | Discharge: 2023-02-03 | Disposition: A | Discharge status: Home / Self Care | Payer: BC Managed Care – PPO | Source: Ambulatory Visit | Attending: Family Medicine | Admitting: Family Medicine

## 2023-02-03 ENCOUNTER — Encounter (HOSPITAL_COMMUNITY): Payer: Self-pay

## 2023-02-03 DIAGNOSIS — Z1231 Encounter for screening mammogram for malignant neoplasm of breast: Secondary | ICD-10-CM | POA: Diagnosis not present

## 2023-02-06 ENCOUNTER — Ambulatory Visit
Admission: RE | Admit: 2023-02-06 | Discharge: 2023-02-06 | Disposition: A | Discharge status: Home / Self Care | Payer: BC Managed Care – PPO | Source: Ambulatory Visit | Attending: Family Medicine | Admitting: Family Medicine

## 2023-02-06 ENCOUNTER — Other Ambulatory Visit (HOSPITAL_COMMUNITY)
Admission: RE | Admit: 2023-02-06 | Discharge: 2023-02-06 | Disposition: A | Discharge status: Home / Self Care | Payer: BC Managed Care – PPO | Source: Ambulatory Visit | Attending: Family Medicine | Admitting: Family Medicine

## 2023-02-06 DIAGNOSIS — E0789 Other specified disorders of thyroid: Secondary | ICD-10-CM | POA: Diagnosis not present

## 2023-02-06 DIAGNOSIS — E041 Nontoxic single thyroid nodule: Secondary | ICD-10-CM

## 2023-02-10 LAB — CYTOLOGY - NON PAP

## 2023-02-11 DIAGNOSIS — E042 Nontoxic multinodular goiter: Secondary | ICD-10-CM | POA: Diagnosis not present

## 2023-02-27 DIAGNOSIS — E063 Autoimmune thyroiditis: Secondary | ICD-10-CM | POA: Diagnosis not present

## 2023-03-03 ENCOUNTER — Encounter (HOSPITAL_COMMUNITY): Payer: Self-pay

## 2023-03-03 DIAGNOSIS — E559 Vitamin D deficiency, unspecified: Secondary | ICD-10-CM | POA: Diagnosis not present

## 2023-03-03 DIAGNOSIS — D510 Vitamin B12 deficiency anemia due to intrinsic factor deficiency: Secondary | ICD-10-CM | POA: Diagnosis not present

## 2023-03-03 DIAGNOSIS — E063 Autoimmune thyroiditis: Secondary | ICD-10-CM | POA: Diagnosis not present

## 2023-03-03 DIAGNOSIS — E785 Hyperlipidemia, unspecified: Secondary | ICD-10-CM | POA: Diagnosis not present

## 2023-03-07 DIAGNOSIS — E063 Autoimmune thyroiditis: Secondary | ICD-10-CM | POA: Diagnosis not present

## 2023-03-07 DIAGNOSIS — E782 Mixed hyperlipidemia: Secondary | ICD-10-CM | POA: Diagnosis not present

## 2023-03-07 DIAGNOSIS — E042 Nontoxic multinodular goiter: Secondary | ICD-10-CM | POA: Diagnosis not present

## 2023-03-07 DIAGNOSIS — Z23 Encounter for immunization: Secondary | ICD-10-CM | POA: Diagnosis not present

## 2023-03-07 DIAGNOSIS — G43009 Migraine without aura, not intractable, without status migrainosus: Secondary | ICD-10-CM | POA: Diagnosis not present

## 2023-03-07 DIAGNOSIS — E559 Vitamin D deficiency, unspecified: Secondary | ICD-10-CM | POA: Diagnosis not present

## 2023-03-07 DIAGNOSIS — Z0000 Encounter for general adult medical examination without abnormal findings: Secondary | ICD-10-CM | POA: Diagnosis not present

## 2023-03-22 DIAGNOSIS — H00011 Hordeolum externum right upper eyelid: Secondary | ICD-10-CM | POA: Diagnosis not present

## 2023-04-10 DIAGNOSIS — E063 Autoimmune thyroiditis: Secondary | ICD-10-CM | POA: Diagnosis not present

## 2023-05-08 ENCOUNTER — Ambulatory Visit: Payer: Self-pay | Admitting: Surgery

## 2023-05-08 DIAGNOSIS — E063 Autoimmune thyroiditis: Secondary | ICD-10-CM | POA: Insufficient documentation

## 2023-05-08 DIAGNOSIS — R499 Unspecified voice and resonance disorder: Secondary | ICD-10-CM | POA: Insufficient documentation

## 2023-05-08 DIAGNOSIS — E042 Nontoxic multinodular goiter: Secondary | ICD-10-CM | POA: Diagnosis not present

## 2023-05-20 NOTE — Patient Instructions (Signed)
DUE TO COVID-19 ONLY TWO VISITORS  (aged 46 and older)  ARE ALLOWED TO COME WITH YOU AND STAY IN THE WAITING ROOM ONLY DURING PRE OP AND PROCEDURE.   **NO VISITORS ARE ALLOWED IN THE SHORT STAY AREA OR RECOVERY ROOM!!**  IF YOU WILL BE ADMITTED INTO THE HOSPITAL YOU ARE ALLOWED ONLY FOUR SUPPORT PEOPLE DURING VISITATION HOURS ONLY (7 AM -8PM)   The support person(s) must pass our screening, gel in and out, and wear a mask at all times, including in the patient's room. Patients must also wear a mask when staff or their support person are in the room. Visitors GUEST BADGE MUST BE WORN VISIBLY  One adult visitor may remain with you overnight and MUST be in the room by 8 P.M.     Your procedure is scheduled on: 05/26/23   Report to [facility] Main Entrance    Report to admitting at : 9:45 AM   Call this number if you have problems the morning of surgery [phone]   Do not eat food :After Midnight.   After Midnight you may have the following liquids until: 9:00 AM DAY OF SURGERY  Water Black Coffee (sugar ok, NO MILK/CREAM OR CREAMERS)  Tea (sugar ok, NO MILK/CREAM OR CREAMERS) regular and decaf                             Plain Jell-O (NO RED)                                           Fruit ices (not with fruit pulp, NO RED)                                     Popsicles (NO RED)                                                                  Juice: apple, WHITE grape, WHITE cranberry Sports drinks like Gatorade (NO RED)              FOLLOW ANY ADDITIONAL PRE OP INSTRUCTIONS YOU RECEIVED FROM YOUR SURGEON'S OFFICE!!!   Oral Hygiene is also important to reduce your risk of infection.                                    Remember - BRUSH YOUR TEETH THE MORNING OF SURGERY WITH YOUR REGULAR TOOTHPASTE  DENTURES WILL BE REMOVED PRIOR TO SURGERY PLEASE DO NOT APPLY "Poly grip" OR ADHESIVES!!!   Do NOT smoke after Midnight   Take these medicines the morning of surgery with A  SIP OF WATER: levothyroxine.Sumatriptan as needed.                              You may not have any metal on your body including hair pins, jewelry, and body piercing  Do not wear make-up, lotions, powders, perfumes/cologne, or deodorant  Do not wear nail polish including gel and S&S, artificial/acrylic nails, or any other type of covering on natural nails including finger and toenails. If you have artificial nails, gel coating, etc. that needs to be removed by a nail salon please have this removed prior to surgery or surgery may need to be canceled/ delayed if the surgeon/ anesthesia feels like they are unable to be safely monitored.   Do not shave  48 hours prior to surgery.    Do not bring valuables to the hospital. [facility] IS NOT             RESPONSIBLE   FOR VALUABLES.   Contacts, glasses, or bridgework may not be worn into surgery.   Bring small overnight bag day of surgery.   DO NOT BRING YOUR HOME MEDICATIONS TO THE HOSPITAL. PHARMACY WILL DISPENSE MEDICATIONS LISTED ON YOUR MEDICATION LIST TO YOU DURING YOUR ADMISSION IN THE HOSPITAL!    Patients discharged on the day of surgery will not be allowed to drive home.  Someone NEEDS to stay with you for the first 24 hours after anesthesia.   Special Instructions: Bring a copy of your healthcare power of attorney and living will documents         the day of surgery if you haven't scanned them before.              Please read over the following fact sheets you were given: IF YOU HAVE QUESTIONS ABOUT YOUR PRE-OP INSTRUCTIONS PLEASE CALL [phone]    [facility] - Preparing for Surgery Before surgery, you can play an important role.  Because skin is not sterile, your skin needs to be as free of germs as possible.  You can reduce the number of germs on your skin by washing with CHG (chlorahexidine gluconate) soap before surgery.  CHG is an antiseptic cleaner which kills germs and bonds with the skin to continue killing  germs even after washing. Please DO NOT use if you have an allergy to CHG or antibacterial soaps.  If your skin becomes reddened/irritated stop using the CHG and inform your nurse when you arrive at Short Stay. Do not shave (including legs and underarms) for at least 48 hours prior to the first CHG shower.  You may shave your face/neck. Please follow these instructions carefully:  1.  Shower with CHG Soap the night before surgery and the  morning of Surgery.  2.  If you choose to wash your hair, wash your hair first as usual with your  normal  shampoo.  3.  After you shampoo, rinse your hair and body thoroughly to remove the  shampoo.                           4.  Use CHG as you would any other liquid soap.  You can apply chg directly  to the skin and wash                       Gently with a scrungie or clean washcloth.  5.  Apply the CHG Soap to your body ONLY FROM THE NECK DOWN.   Do not use on face/ open                           Wound or open sores. Avoid contact with eyes,  ears mouth and genitals (private parts).                       Wash face,  Genitals (private parts) with your normal soap.             6.  Wash thoroughly, paying special attention to the area where your surgery  will be performed.  7.  Thoroughly rinse your body with warm water from the neck down.  8.  DO NOT shower/wash with your normal soap after using and rinsing off  the CHG Soap.                9.  Pat yourself dry with a clean towel.            10.  Wear clean pajamas.            11.  Place clean sheets on your bed the night of your first shower and do not  sleep with pets. Day of Surgery : Do not apply any lotions/deodorants the morning of surgery.  Please wear clean clothes to the hospital/surgery center.  FAILURE TO FOLLOW THESE INSTRUCTIONS MAY RESULT IN THE CANCELLATION OF YOUR SURGERY PATIENT SIGNATURE_________________________________  NURSE  SIGNATURE__________________________________  ________________________________________________________________________

## 2023-05-21 ENCOUNTER — Ambulatory Visit (HOSPITAL_COMMUNITY)
Admission: RE | Admit: 2023-05-21 | Discharge: 2023-05-21 | Disposition: A | Discharge status: Home / Self Care | Payer: BC Managed Care – PPO | Source: Ambulatory Visit | Attending: Anesthesiology | Admitting: Anesthesiology

## 2023-05-21 ENCOUNTER — Other Ambulatory Visit: Payer: Self-pay

## 2023-05-21 ENCOUNTER — Encounter (HOSPITAL_COMMUNITY)
Admission: RE | Admit: 2023-05-21 | Discharge: 2023-05-21 | Disposition: A | Discharge status: Home / Self Care | Payer: BC Managed Care – PPO | Source: Ambulatory Visit | Attending: Surgery | Admitting: Surgery

## 2023-05-21 ENCOUNTER — Encounter (HOSPITAL_COMMUNITY): Payer: Self-pay

## 2023-05-21 VITALS — BP 140/89 | HR 78 | Temp 98.6°F | Ht 62.0 in | Wt 236.0 lb

## 2023-05-21 DIAGNOSIS — R011 Cardiac murmur, unspecified: Secondary | ICD-10-CM | POA: Insufficient documentation

## 2023-05-21 DIAGNOSIS — Z01818 Encounter for other preprocedural examination: Secondary | ICD-10-CM | POA: Diagnosis not present

## 2023-05-21 HISTORY — DX: Other specified postprocedural states: Z98.890

## 2023-05-21 HISTORY — DX: Unspecified osteoarthritis, unspecified site: M19.90

## 2023-05-21 HISTORY — DX: Nausea with vomiting, unspecified: R11.2

## 2023-05-21 HISTORY — DX: Cardiac murmur, unspecified: R01.1

## 2023-05-21 HISTORY — DX: Other complications of anesthesia, initial encounter: T88.59XA

## 2023-05-21 LAB — CBC
HCT: 42.6 % (ref 36.0–46.0)
Hemoglobin: 13.6 g/dL (ref 12.0–15.0)
MCH: 27.1 pg (ref 26.0–34.0)
MCHC: 31.9 g/dL (ref 30.0–36.0)
MCV: 84.9 fL (ref 80.0–100.0)
Platelets: 228 10*3/uL (ref 150–400)
RBC: 5.02 MIL/uL (ref 3.87–5.11)
RDW: 14 % (ref 11.5–15.5)
WBC: 5.4 10*3/uL (ref 4.0–10.5)
nRBC: 0 % (ref 0.0–0.2)

## 2023-05-21 LAB — BASIC METABOLIC PANEL
Anion gap: 7 (ref 5–15)
BUN: 11 mg/dL (ref 6–20)
CO2: 24 mmol/L (ref 22–32)
Calcium: 9.1 mg/dL (ref 8.9–10.3)
Chloride: 106 mmol/L (ref 98–111)
Creatinine, Ser: 0.46 mg/dL (ref 0.44–1.00)
GFR, Estimated: >60 mL/min (ref >60)
Glucose, Bld: 96 mg/dL (ref 70–99)
Potassium: 4.2 mmol/L (ref 3.5–5.1)
Sodium: 137 mmol/L (ref 135–145)

## 2023-05-21 NOTE — Progress Notes (Signed)
For Anesthesia: PCP - Benita Stabile, MD  Cardiologist - Jonelle Sidle, MD   Bowel Prep reminder:  Chest x-ray -  EKG - 05/21/23 Stress Test - 01/03/21 ECHO - 01/03/21 Cardiac Cath -  Pacemaker/ICD device last checked: Pacemaker orders received: Device Rep notified:  Spinal Cord Stimulator:N/A  Sleep Study - N/A CPAP -   Fasting Blood Sugar - N/A Checks Blood Sugar _____ times a day Date and result of last Hgb A1c-  Last dose of GLP1 agonist- N/A GLP1 instructions:   Last dose of SGLT-2 inhibitors- N/A SGLT-2 instructions:   Blood Thinner Instructions:N/A Aspirin Instructions: Last Dose:  Activity level: Can go up a flight of stairs and activities of daily living without stopping and without chest pain and/or shortness of breath   Able to exercise without chest pain and/or shortness of breath  Anesthesia review: Hx: Heart murmur.  Patient denies shortness of breath, fever, cough and chest pain at PAT appointment   Patient verbalized understanding of instructions that were given to them at the PAT appointment. Patient was also instructed that they will need to review over the PAT instructions again at home before surgery.

## 2023-05-25 ENCOUNTER — Encounter (HOSPITAL_COMMUNITY): Payer: Self-pay | Admitting: Surgery

## 2023-05-25 DIAGNOSIS — E063 Autoimmune thyroiditis: Secondary | ICD-10-CM | POA: Diagnosis present

## 2023-05-25 DIAGNOSIS — E042 Nontoxic multinodular goiter: Secondary | ICD-10-CM | POA: Diagnosis present

## 2023-05-25 NOTE — H&P (Signed)
REFERRING PHYSICIAN: Dennie Maizes, NP  PROVIDER: Blayke Pinera Myra Rude, MD    Chief Complaint: New Consultation ( Multinodular thyroid goiter)  History of Present Illness:  Patient is referred by Dennie Maizes, NP, for surgical evaluation and management of multiple thyroid nodules and symptomatic Hashimoto's thyroiditis. Patient became symptomatic with neck discomfort and neck swelling several months ago. She noted persistent symptoms and sought medical attention. She underwent an ultrasound in September 2024 showing a mildly enlarged thyroid gland containing bilateral thyroid nodules. Patient underwent fine-needle aspiration biopsy of both nodules in the right thyroid lobe. The smaller of the 2 nodules demonstrated atypia requiring testing with AFIRMA. Fortunately the results were benign. Patient is also tested positive for Hashimoto's thyroiditis. She has developed mild hypothyroidism and is currently on low-dose levothyroxine. Patient complains of neck discomfort. She notes pain with swallowing. She has had problems with solid food dysphagia. She has a chronic cough. She has a persistent globus sensation. She has noted hair loss. Patient is interested in proceeding with thyroidectomy. She is accompanied by her husband. She works in English as a second language teacher for [employer].  Review of Systems: A complete review of systems was obtained from the patient. I have reviewed this information and discussed as appropriate with the patient. See HPI as well for other ROS.  Review of Systems  Constitutional: Positive for malaise/fatigue.  HENT: Positive for sore throat.  Hoarseness  Eyes: Negative.  Respiratory: Positive for cough.  Cardiovascular: Negative.  Gastrointestinal:  Dysphagia with solid foods  Genitourinary: Negative.  Musculoskeletal: Negative.  Skin: Negative.  Neurological: Negative. Negative for tremors.  Endo/Heme/Allergies: Negative.  Psychiatric/Behavioral:  Negative.    Medical History: Past Medical History:  Diagnosis Date  Anxiety  Arthritis  Hyperlipidemia  Thyroid disease   Patient Active Problem List  Diagnosis  Multiple thyroid nodules  Hashimoto's thyroiditis  Hypothyroidism due to Hashimoto's thyroiditis  Change in voice   Past Surgical History:  Procedure Laterality Date  APPENDECTOMY  CESAREAN SECTION  HYSTERECTOMY    Allergies  Allergen Reactions  Penicillin Swelling   Current Outpatient Medications on File Prior to Visit  Medication Sig Dispense Refill  AIMOVIG AUTOINJECTOR 140 mg/mL AtIn  levothyroxine (SYNTHROID) 25 MCG tablet TAKE 1 TABLET BY MOUTH ON MONDAY, WEDNESDAY AND FRIDAY IN THE MORNING ON AN EMPTY STOMACH  multivitamin tablet Take 1 tablet by mouth once daily  ondansetron (ZOFRAN) 4 MG tablet Take 4 mg by mouth every 8 (eight) hours as needed for Nausea  SUMAtriptan (IMITREX) 100 MG tablet TAKE 1 TABLET BY MOUTH AT ONSET OF MIGRAINE. MAY REPEAT ONCE AFTER 2 HOURS IF NEEDED. (Max Daily Dose 2 TABLETS)   No current facility-administered medications on file prior to visit.   Family History  Problem Relation Age of Onset  Skin cancer Mother  Obesity Mother  High blood pressure (Hypertension) Mother  Hyperlipidemia (Elevated cholesterol) Mother  Coronary Artery Disease (Blocked arteries around heart) Mother  Diabetes Mother  Obesity Brother  Diabetes Brother    Social History   Tobacco Use  Smoking Status Never  Smokeless Tobacco Never    Social History   Socioeconomic History  Marital status: Married  Tobacco Use  Smoking status: Never  Smokeless tobacco: Never  Substance and Sexual Activity  Alcohol use: Yes  Drug use: Never   Social Drivers of Health   Housing Stability: Unknown (05/08/2023)  Housing Stability Vital Sign  Homeless in the Last Year: No   Objective:   Vitals:  BP: 113/80  Pulse: 100  Temp: 36.7 C (98 F)  SpO2: 97%  Weight: (!) 106.1 kg (234 lb)   Height: 157.5 cm (5\' 2" )   Body mass index is 42.8 kg/m.  Physical Exam   GENERAL APPEARANCE Comfortable, no acute issues Development: normal Gross deformities: none  SKIN Rash, lesions, ulcers: none Induration, erythema: none Nodules: none palpable  EYES Conjunctiva and lids: normal Pupils: equal  EARS, NOSE, MOUTH, THROAT External ears: no lesion or deformity External nose: no lesion or deformity Hearing: grossly normal  NECK Symmetric: yes Trachea: midline Thyroid: There is tenderness to palpation over both the right and left thyroid lobes. There is subtle smooth relatively soft nodularity in the right thyroid lobe. There is no associated lymphadenopathy.  CHEST/CV Not assessed  ABDOMEN Not assessed  GENITOURINARY/RECTAL Not assessed  MUSCULOSKELETAL Station and gait: normal Digits and nails: no clubbing or cyanosis Muscle strength: grossly normal all extremities Deformity: none  LYMPHATIC Cervical: none palpable Supraclavicular: none palpable  PSYCHIATRIC Oriented to person, place, and time: yes Mood and affect: normal for situation Judgment and insight: appropriate for situation   Assessment and Plan:   Multiple thyroid nodules Hashimoto's thyroiditis Hypothyroidism due to Hashimoto's thyroiditis Change in voice  Patient is referred by her endocrinologist for surgical evaluation and recommendations regarding management of an enlarged thyroid, multiple thyroid nodules, and symptomatic Hashimoto's thyroiditis.  Patient provided with a copy of "The Thyroid Book: Medical and Surgical Treatment of Thyroid Problems", published by [employer], 16 pages. Book reviewed and explained to patient during visit today.  Today we reviewed her clinical course and symptoms. We reviewed her ultrasound report as well as her biopsy results and cytopathology results. We discussed thyroid surgery, specifically total thyroidectomy. We discussed the risk and benefits of  surgery including recurrent laryngeal nerve injury and injury to parathyroid glands. We discussed potential other etiologies for changes in voice quality. We discussed the size and location of the surgical incision. We discussed the lifelong need for thyroid hormone replacement. We discussed the hospital stay to be anticipated as well as the postoperative recovery and returned to work and activities. The patient understands and wishes to proceed with surgery in the near future.   Darnell Level, MD [facility] A [facility] practice Office: [phone]

## 2023-05-26 ENCOUNTER — Other Ambulatory Visit: Payer: Self-pay

## 2023-05-26 ENCOUNTER — Encounter (HOSPITAL_COMMUNITY): Payer: Self-pay | Admitting: Surgery

## 2023-05-26 ENCOUNTER — Ambulatory Visit (HOSPITAL_COMMUNITY): Payer: BC Managed Care – PPO | Admitting: Physician Assistant

## 2023-05-26 ENCOUNTER — Ambulatory Visit (HOSPITAL_COMMUNITY): Payer: BC Managed Care – PPO | Admitting: Anesthesiology

## 2023-05-26 ENCOUNTER — Encounter (HOSPITAL_COMMUNITY)
Admission: RE | Disposition: A | Discharge status: Home / Self Care | Payer: Self-pay | Source: Home / Self Care | Attending: Surgery

## 2023-05-26 ENCOUNTER — Ambulatory Visit (HOSPITAL_COMMUNITY)
Admission: RE | Admit: 2023-05-26 | Discharge: 2023-05-27 | Disposition: A | Discharge status: Home / Self Care | Payer: BC Managed Care – PPO | Attending: Surgery | Admitting: Surgery

## 2023-05-26 DIAGNOSIS — Z79890 Hormone replacement therapy (postmenopausal): Secondary | ICD-10-CM | POA: Diagnosis not present

## 2023-05-26 DIAGNOSIS — E041 Nontoxic single thyroid nodule: Secondary | ICD-10-CM | POA: Diagnosis not present

## 2023-05-26 DIAGNOSIS — R053 Chronic cough: Secondary | ICD-10-CM | POA: Insufficient documentation

## 2023-05-26 DIAGNOSIS — E063 Autoimmune thyroiditis: Secondary | ICD-10-CM | POA: Diagnosis not present

## 2023-05-26 DIAGNOSIS — E042 Nontoxic multinodular goiter: Secondary | ICD-10-CM | POA: Diagnosis present

## 2023-05-26 HISTORY — PX: THYROIDECTOMY: SHX17

## 2023-05-26 SURGERY — THYROIDECTOMY
Anesthesia: General | Site: Neck

## 2023-05-26 MED ORDER — ONDANSETRON HCL 4 MG/2ML IJ SOLN
INTRAMUSCULAR | Status: AC
  Filled 2023-05-26: qty 2

## 2023-05-26 MED ORDER — LABETALOL HCL 5 MG/ML IV SOLN
INTRAVENOUS | Status: DC | PRN
  Administered 2023-05-26 (×5): 2.5 mg via INTRAVENOUS

## 2023-05-26 MED ORDER — KETAMINE HCL 50 MG/5ML IJ SOSY
PREFILLED_SYRINGE | INTRAMUSCULAR | Status: AC
  Filled 2023-05-26: qty 5

## 2023-05-26 MED ORDER — CHLORHEXIDINE GLUCONATE CLOTH 2 % EX PADS: 6.0000 | MEDICATED_PAD | Freq: Once | CUTANEOUS | Status: DC

## 2023-05-26 MED ORDER — ACETAMINOPHEN 650 MG RE SUPP: 650.0000 mg | Freq: Four times a day (QID) | RECTAL | Status: DC | PRN

## 2023-05-26 MED ORDER — TRAMADOL HCL 50 MG PO TABS
50.0000 mg | ORAL_TABLET | Freq: Four times a day (QID) | ORAL | Status: DC | PRN
  Administered 2023-05-26: 50 mg via ORAL
  Filled 2023-05-26: qty 1

## 2023-05-26 MED ORDER — ACETAMINOPHEN 10 MG/ML IV SOLN
1000.0000 mg | Freq: Once | INTRAVENOUS | Status: DC | PRN
  Administered 2023-05-26: 1000 mg via INTRAVENOUS

## 2023-05-26 MED ORDER — DEXMEDETOMIDINE HCL IN NACL 80 MCG/20ML IV SOLN
INTRAVENOUS | Status: DC | PRN
  Administered 2023-05-26 (×3): 4 ug via INTRAVENOUS

## 2023-05-26 MED ORDER — HYDROMORPHONE HCL 1 MG/ML IJ SOLN
0.2500 mg | INTRAMUSCULAR | Status: DC | PRN
  Administered 2023-05-26 (×3): 0.5 mg via INTRAVENOUS

## 2023-05-26 MED ORDER — FENTANYL CITRATE PF 50 MCG/ML IJ SOSY
PREFILLED_SYRINGE | INTRAMUSCULAR | Status: AC
  Filled 2023-05-26: qty 2

## 2023-05-26 MED ORDER — OXYCODONE HCL 5 MG/5ML PO SOLN: 5.0000 mg | Freq: Once | ORAL | Status: AC | PRN

## 2023-05-26 MED ORDER — MIDAZOLAM HCL 5 MG/5ML IJ SOLN
INTRAMUSCULAR | Status: DC | PRN
  Administered 2023-05-26 (×2): 1 mg via INTRAVENOUS

## 2023-05-26 MED ORDER — ONDANSETRON HCL 4 MG/2ML IJ SOLN
INTRAMUSCULAR | Status: DC | PRN
  Administered 2023-05-26: 4 mg via INTRAVENOUS

## 2023-05-26 MED ORDER — ACETAMINOPHEN 10 MG/ML IV SOLN
INTRAVENOUS | Status: AC
  Filled 2023-05-26: qty 100

## 2023-05-26 MED ORDER — DEXAMETHASONE SODIUM PHOSPHATE 10 MG/ML IJ SOLN
INTRAMUSCULAR | Status: AC
  Filled 2023-05-26: qty 1

## 2023-05-26 MED ORDER — OXYCODONE HCL 5 MG PO TABS
5.0000 mg | ORAL_TABLET | ORAL | Status: DC | PRN
  Administered 2023-05-27 (×2): 10 mg via ORAL
  Filled 2023-05-26 (×2): qty 2

## 2023-05-26 MED ORDER — 0.9 % SODIUM CHLORIDE (POUR BTL) OPTIME
TOPICAL | Status: DC | PRN
  Administered 2023-05-26: 1000 mL

## 2023-05-26 MED ORDER — ONDANSETRON 4 MG PO TBDP: 4.0000 mg | ORAL_TABLET | Freq: Four times a day (QID) | ORAL | Status: DC | PRN

## 2023-05-26 MED ORDER — ORAL CARE MOUTH RINSE: 15.0000 mL | Freq: Once | OROMUCOSAL | Status: AC

## 2023-05-26 MED ORDER — LACTATED RINGERS IV SOLN: INTRAVENOUS | Status: DC

## 2023-05-26 MED ORDER — CIPROFLOXACIN IN D5W 400 MG/200ML IV SOLN
400.0000 mg | INTRAVENOUS | Status: AC
  Administered 2023-05-26: 400 mg via INTRAVENOUS
  Filled 2023-05-26: qty 200

## 2023-05-26 MED ORDER — SUGAMMADEX SODIUM 200 MG/2ML IV SOLN
INTRAVENOUS | Status: DC | PRN
  Administered 2023-05-26: 200 mg via INTRAVENOUS

## 2023-05-26 MED ORDER — PROCHLORPERAZINE EDISYLATE 10 MG/2ML IJ SOLN
10.0000 mg | Freq: Four times a day (QID) | INTRAMUSCULAR | Status: DC | PRN
  Administered 2023-05-26 – 2023-05-27 (×2): 10 mg via INTRAVENOUS
  Filled 2023-05-26 (×2): qty 2

## 2023-05-26 MED ORDER — OXYCODONE HCL 5 MG PO TABS
5.0000 mg | ORAL_TABLET | Freq: Once | ORAL | Status: AC | PRN
Start: 2023-05-26 — End: 2023-05-26
  Administered 2023-05-26: 5 mg via ORAL

## 2023-05-26 MED ORDER — ONDANSETRON HCL 4 MG/2ML IJ SOLN
4.0000 mg | Freq: Once | INTRAMUSCULAR | Status: DC | PRN
Start: 2023-05-26 — End: 2023-05-26

## 2023-05-26 MED ORDER — LIDOCAINE HCL (PF) 2 % IJ SOLN
INTRAMUSCULAR | Status: AC
  Filled 2023-05-26: qty 5

## 2023-05-26 MED ORDER — CALCIUM CARBONATE 1250 (500 CA) MG PO TABS
2.0000 | ORAL_TABLET | Freq: Three times a day (TID) | ORAL | Status: DC
  Administered 2023-05-27: 2500 mg via ORAL
  Filled 2023-05-26: qty 2

## 2023-05-26 MED ORDER — ROCURONIUM BROMIDE 10 MG/ML (PF) SYRINGE
PREFILLED_SYRINGE | INTRAVENOUS | Status: AC
  Filled 2023-05-26: qty 10

## 2023-05-26 MED ORDER — LIDOCAINE HCL (CARDIAC) PF 100 MG/5ML IV SOSY
PREFILLED_SYRINGE | INTRAVENOUS | Status: DC | PRN
  Administered 2023-05-26: 60 mg via INTRAVENOUS

## 2023-05-26 MED ORDER — ROCURONIUM BROMIDE 100 MG/10ML IV SOLN
INTRAVENOUS | Status: DC | PRN
  Administered 2023-05-26: 5 mg via INTRAVENOUS
  Administered 2023-05-26: 100 mg via INTRAVENOUS

## 2023-05-26 MED ORDER — ACETAMINOPHEN 325 MG PO TABS: 650.0000 mg | ORAL_TABLET | Freq: Four times a day (QID) | ORAL | Status: DC | PRN

## 2023-05-26 MED ORDER — PROPOFOL 500 MG/50ML IV EMUL
INTRAVENOUS | Status: DC | PRN
  Administered 2023-05-26: 150 ug/kg/min via INTRAVENOUS

## 2023-05-26 MED ORDER — HEMOSTATIC AGENTS (NO CHARGE) OPTIME
TOPICAL | Status: DC | PRN
  Administered 2023-05-26: 1 via TOPICAL

## 2023-05-26 MED ORDER — PROPOFOL 10 MG/ML IV BOLUS
INTRAVENOUS | Status: DC | PRN
  Administered 2023-05-26: 20 mg via INTRAVENOUS
  Administered 2023-05-26: 170 mg via INTRAVENOUS

## 2023-05-26 MED ORDER — DEXAMETHASONE SODIUM PHOSPHATE 10 MG/ML IJ SOLN
INTRAMUSCULAR | Status: DC | PRN
  Administered 2023-05-26: 88 mg via INTRAVENOUS

## 2023-05-26 MED ORDER — KETAMINE HCL 10 MG/ML IJ SOLN
INTRAMUSCULAR | Status: DC | PRN
  Administered 2023-05-26: 20 mg via INTRAVENOUS

## 2023-05-26 MED ORDER — FENTANYL CITRATE (PF) 100 MCG/2ML IJ SOLN
INTRAMUSCULAR | Status: AC
  Filled 2023-05-26: qty 2

## 2023-05-26 MED ORDER — ALBUMIN HUMAN 5 % IV SOLN
INTRAVENOUS | Status: AC
  Filled 2023-05-26: qty 250

## 2023-05-26 MED ORDER — PROPOFOL 500 MG/50ML IV EMUL
INTRAVENOUS | Status: AC
  Filled 2023-05-26: qty 50

## 2023-05-26 MED ORDER — CHLORHEXIDINE GLUCONATE 0.12 % MT SOLN
15.0000 mL | Freq: Once | OROMUCOSAL | Status: AC
  Administered 2023-05-26: 15 mL via OROMUCOSAL

## 2023-05-26 MED ORDER — ONDANSETRON HCL 4 MG/2ML IJ SOLN
4.0000 mg | Freq: Four times a day (QID) | INTRAMUSCULAR | Status: DC | PRN
  Administered 2023-05-26 – 2023-05-27 (×2): 4 mg via INTRAVENOUS
  Filled 2023-05-26 (×2): qty 2

## 2023-05-26 MED ORDER — FENTANYL CITRATE (PF) 250 MCG/5ML IJ SOLN
INTRAMUSCULAR | Status: AC
  Filled 2023-05-26: qty 5

## 2023-05-26 MED ORDER — HYDROMORPHONE HCL 1 MG/ML IJ SOLN
INTRAMUSCULAR | Status: AC
  Administered 2023-05-26: 0.5 mg via INTRAVENOUS
  Filled 2023-05-26: qty 2

## 2023-05-26 MED ORDER — OXYCODONE HCL 5 MG PO TABS
ORAL_TABLET | ORAL | Status: AC
  Filled 2023-05-26: qty 1

## 2023-05-26 MED ORDER — SODIUM CHLORIDE 0.45 % IV SOLN
INTRAVENOUS | Status: DC
Start: 2023-05-26 — End: 2023-05-27

## 2023-05-26 MED ORDER — FENTANYL CITRATE PF 50 MCG/ML IJ SOSY
25.0000 ug | PREFILLED_SYRINGE | INTRAMUSCULAR | Status: DC | PRN
  Administered 2023-05-26: 50 ug via INTRAVENOUS

## 2023-05-26 MED ORDER — MIDAZOLAM HCL 2 MG/2ML IJ SOLN
INTRAMUSCULAR | Status: AC
  Filled 2023-05-26: qty 2

## 2023-05-26 MED ORDER — PROPOFOL 10 MG/ML IV BOLUS
INTRAVENOUS | Status: AC
  Filled 2023-05-26: qty 20

## 2023-05-26 MED ORDER — FENTANYL CITRATE (PF) 100 MCG/2ML IJ SOLN
INTRAMUSCULAR | Status: DC | PRN
  Administered 2023-05-26 (×3): 25 ug via INTRAVENOUS
  Administered 2023-05-26: 100 ug via INTRAVENOUS
  Administered 2023-05-26: 25 ug via INTRAVENOUS
  Administered 2023-05-26 (×3): 50 ug via INTRAVENOUS

## 2023-05-26 MED ORDER — LABETALOL HCL 5 MG/ML IV SOLN
INTRAVENOUS | Status: AC
  Filled 2023-05-26: qty 4

## 2023-05-26 MED ORDER — HYDROMORPHONE HCL 1 MG/ML IJ SOLN
1.0000 mg | INTRAMUSCULAR | Status: DC | PRN
  Administered 2023-05-26 (×2): 1 mg via INTRAVENOUS
  Filled 2023-05-26 (×2): qty 1

## 2023-05-26 SURGICAL SUPPLY — 27 items
ATTRACTOMAT 16X20 MAGNETIC DRP (DRAPES) ×1 IMPLANT
BAG COUNTER SPONGE SURGICOUNT (BAG) ×1 IMPLANT
BLADE SURG 15 STRL LF DISP TIS (BLADE) ×1 IMPLANT
CHLORAPREP W/TINT 26 (MISCELLANEOUS) ×1 IMPLANT
CLIP TI MEDIUM 6 (CLIP) ×2 IMPLANT
CLIP TI WIDE RED SMALL 6 (CLIP) ×2 IMPLANT
COVER SURGICAL LIGHT HANDLE (MISCELLANEOUS) ×1 IMPLANT
DERMABOND ADVANCED .7 DNX12 (GAUZE/BANDAGES/DRESSINGS) ×1 IMPLANT
DRAPE LAPAROTOMY T 98X78 PEDS (DRAPES) ×1 IMPLANT
DRAPE UTILITY XL STRL (DRAPES) ×1 IMPLANT
ELECT PENCIL ROCKER SW 15FT (MISCELLANEOUS) ×1 IMPLANT
ELECT REM PT RETURN 15FT ADLT (MISCELLANEOUS) ×1 IMPLANT
GAUZE 4X4 16PLY ~~LOC~~+RFID DBL (SPONGE) ×1 IMPLANT
GLOVE SURG ORTHO 8.0 STRL STRW (GLOVE) ×1 IMPLANT
GOWN STRL REUS W/ TWL XL LVL3 (GOWN DISPOSABLE) ×2 IMPLANT
HEMOSTAT SURGICEL 2X4 FIBR (HEMOSTASIS) ×1 IMPLANT
ILLUMINATOR WAVEGUIDE N/F (MISCELLANEOUS) ×1 IMPLANT
KIT BASIN OR (CUSTOM PROCEDURE TRAY) ×1 IMPLANT
KIT TURNOVER KIT A (KITS) IMPLANT
PACK BASIC VI WITH GOWN DISP (CUSTOM PROCEDURE TRAY) ×1 IMPLANT
SHEARS HARMONIC 9CM CVD (BLADE) ×1 IMPLANT
SUT MNCRL AB 4-0 PS2 18 (SUTURE) ×1 IMPLANT
SUT SILK 3 0 SH 30 (SUTURE) ×1 IMPLANT
SUT VIC AB 3-0 SH 18 (SUTURE) ×2 IMPLANT
SYR BULB IRRIG 60ML STRL (SYRINGE) ×1 IMPLANT
TOWEL OR 17X26 10 PK STRL BLUE (TOWEL DISPOSABLE) ×1 IMPLANT
TUBING CONNECTING 10 (TUBING) ×1 IMPLANT

## 2023-05-26 NOTE — Transfer of Care (Signed)
Immediate Anesthesia Transfer of Care Note  Patient: [facility]  Procedure(s) Performed: TOTAL THYROIDECTOMY (Neck)  Patient Location: PACU  Anesthesia Type:General  Level of Consciousness: awake, alert , oriented, and patient cooperative  Airway & Oxygen Therapy: Patient Spontanous Breathing and Patient connected to face mask oxygen  Post-op Assessment: Report given to RN and Post -op Vital signs reviewed and stable  Post vital signs: Reviewed and stable  Last Vitals:  Vitals Value Taken Time  BP 152/94 05/26/23 1423  Temp 36.7 C 05/26/23 1423  Pulse 65 05/26/23 1425  Resp 19 05/26/23 1424  SpO2 100 % 05/26/23 1425  Vitals shown include unfiled device data.  Last Pain:  Vitals:   05/26/23 1423  TempSrc:   PainSc: 10-Worst pain ever         Complications: No notable events documented.

## 2023-05-26 NOTE — Op Note (Signed)
Procedure Note  Pre-operative Diagnosis:  multiple thyroid nodules, Hashimoto's thyroiditis   Post-operative Diagnosis:  same  Surgeon:  Darnell Level, MD  Assistant:  none   Procedure:  Total thyroidectomy  Anesthesia:  General  Estimated Blood Loss:  25 cc  Drains: none         Specimen: thyroid to pathology  Indications:  Patient is referred by Dennie Maizes, NP, for surgical evaluation and management of multiple thyroid nodules and symptomatic Hashimoto's thyroiditis. Patient became symptomatic with neck discomfort and neck swelling several months ago. She noted persistent symptoms and sought medical attention. She underwent an ultrasound in September 2024 showing a mildly enlarged thyroid gland containing bilateral thyroid nodules. Patient underwent fine-needle aspiration biopsy of both nodules in the right thyroid lobe. The smaller of the 2 nodules demonstrated atypia requiring testing with AFIRMA. Fortunately the results were benign. Patient is also tested positive for Hashimoto's thyroiditis. She has developed mild hypothyroidism and is currently on low-dose levothyroxine. Patient complains of neck discomfort. She notes pain with swallowing. She has had problems with solid food dysphagia. She has a chronic cough. She has a persistent globus sensation. She has noted hair loss. Patient is interested in proceeding with thyroidectomy. She is accompanied by her husband. She works in English as a second language teacher for [employer].   Procedure Details: Procedure was done in OR #1 at the [facility]. The patient was brought to the operating room and placed in a supine position on the operating room table. Following administration of general anesthesia, the patient was positioned and then prepped and draped in the usual aseptic fashion. After ascertaining that an adequate level of anesthesia had been achieved, a small Kocher incision was made with #15 blade. Dissection was carried  through subcutaneous tissues and platysma.Hemostasis was achieved with the electrocautery. Skin flaps were elevated cephalad and caudad from the thyroid notch to the sternal notch. A Mahorner self-retaining retractor was placed for exposure. Strap muscles were incised in the midline and dissection was begun on the left side.  Strap muscles were reflected laterally.  Left thyroid lobe was normal in size with small nodules.  The left lobe was gently mobilized with blunt dissection. Superior pole vessels were dissected out and divided individually between small and medium ligaclips with the harmonic scalpel. The thyroid lobe was rolled anteriorly. Branches of the inferior thyroid artery were divided between small ligaclips with the harmonic scalpel. Inferior venous tributaries were divided between ligaclips. Both the superior and inferior parathyroid glands were identified and preserved on their vascular pedicles. The recurrent laryngeal nerve was identified and preserved along its course. The ligament of Allyson Sabal was released with the electrocautery and the gland was mobilized onto the anterior trachea. Isthmus was mobilized across the midline. There was a small pyramidal lobe present which was dissected out and resected en bloc with the isthmus. Dry pack was placed in the left neck.  The right thyroid lobe was gently mobilized with blunt dissection. Right thyroid lobe was normal in size with small nodules. Superior pole vessels were dissected out and divided between small and medium ligaclips with the Harmonic scalpel. Superior parathyroid was identified and preserved. Inferior venous tributaries were divided between medium ligaclips with the harmonic scalpel. The right thyroid lobe was rolled anteriorly and the branches of the inferior thyroid artery divided between small ligaclips. The right recurrent laryngeal nerve was identified and preserved along its course. The ligament of Allyson Sabal was released with the  electrocautery. The right thyroid lobe  was mobilized onto the anterior trachea and the remainder of the thyroid was dissected off the anterior trachea and the thyroid was completely excised. A suture was used to mark the left lobe. The entire thyroid gland was submitted to pathology for review.  Palpation of the operative field demonstrated no evidence of residual disease and no abnormal lymph nodes.  The neck was irrigated with warm saline. Fibrillar was placed throughout the operative field. Strap muscles were approximated in the midline with interrupted 3-0 Vicryl sutures. Platysma was closed with interrupted 3-0 Vicryl sutures. Skin was closed with a running 4-0 Monocryl subcuticular suture. Wound was washed and Dermabond was applied. The patient was awakened from anesthesia and brought to the recovery room. The patient tolerated the procedure well.   Darnell Level, MD [facility] Office: [phone]

## 2023-05-26 NOTE — Anesthesia Procedure Notes (Signed)
Procedure Name: Intubation Date/Time: 05/26/2023 12:24 PM  Performed by: Garth Bigness, CRNAPre-anesthesia Checklist: Patient identified, Emergency Drugs available, Suction available and Patient being monitored Patient Re-evaluated:Patient Re-evaluated prior to induction Oxygen Delivery Method: Circle system utilized Preoxygenation: Pre-oxygenation with 100% oxygen Induction Type: IV induction Ventilation: Mask ventilation without difficulty Laryngoscope Size: Mac and 3 Grade View: Grade I Tube type: Oral Tube size: 7.0 mm Number of attempts: 1 Airway Equipment and Method: Stylet Placement Confirmation: ETT inserted through vocal cords under direct vision, positive ETCO2 and breath sounds checked- equal and bilateral Secured at: 22 cm Tube secured with: Tape Dental Injury: Teeth and Oropharynx as per pre-operative assessment

## 2023-05-26 NOTE — Interval H&P Note (Signed)
History and Physical Interval Note:  05/26/2023 11:53 AM  [facility] [place]  has presented today for surgery, with the diagnosis of MULTIPLE THYROID NODULES HAHIMOTO'S THYROIDITIS.  The various methods of treatment have been discussed with the patient and family. After consideration of risks, benefits and other options for treatment, the patient has consented to    Procedure(s): TOTAL THYROIDECTOMY (N/A) as a surgical intervention.    The patient's history has been reviewed, patient examined, no change in status, stable for surgery.  I have reviewed the patient's chart and labs.  Questions were answered to the patient's satisfaction.    Darnell Level, MD [facility] practice Office: [phone]   Darnell Level

## 2023-05-26 NOTE — Anesthesia Preprocedure Evaluation (Signed)
Anesthesia Evaluation  Patient identified by MRN, date of birth, ID band Patient awake    Reviewed: Allergy & Precautions, NPO status , Patient's Chart, lab work & pertinent test results, reviewed documented beta blocker date and time   History of Anesthesia Complications (+) PONV and history of anesthetic complications  Airway Mallampati: II  TM Distance: >3 FB     Dental no notable dental hx.    Pulmonary neg COPD, neg PE   breath sounds clear to auscultation       Cardiovascular (-) hypertension(-) CAD, (-) Past MI and (-) Cardiac Stents + Valvular Problems/Murmurs  Rhythm:Regular Rate:Normal     Neuro/Psych  Headaches, neg Seizures PSYCHIATRIC DISORDERS Anxiety Depression       GI/Hepatic ,neg GERD  ,,(+) neg Cirrhosis        Endo/Other    Renal/GU Renal disease     Musculoskeletal  (+) Arthritis ,    Abdominal   Peds  Hematology   Anesthesia Other Findings   Reproductive/Obstetrics                             Anesthesia Physical Anesthesia Plan  ASA: 2  Anesthesia Plan: General   Post-op Pain Management:    Induction: Intravenous  PONV Risk Score and Plan: 2 and TIVA, Ondansetron and Dexamethasone  Airway Management Planned: Oral ETT  Additional Equipment:   Intra-op Plan:   Post-operative Plan: Extubation in OR  Informed Consent: I have reviewed the patients History and Physical, chart, labs and discussed the procedure including the risks, benefits and alternatives for the proposed anesthesia with the patient or authorized representative who has indicated his/her understanding and acceptance.     Dental advisory given  Plan Discussed with: CRNA  Anesthesia Plan Comments:        Anesthesia Quick Evaluation

## 2023-05-26 NOTE — Anesthesia Postprocedure Evaluation (Signed)
Anesthesia Post Note  Patient: [facility]  Procedure(s) Performed: TOTAL THYROIDECTOMY (Neck)     Patient location during evaluation: PACU Anesthesia Type: General Level of consciousness: awake and alert Pain management: pain level controlled Vital Signs Assessment: post-procedure vital signs reviewed and stable Respiratory status: spontaneous breathing, nonlabored ventilation, respiratory function stable and patient connected to nasal cannula oxygen Cardiovascular status: blood pressure returned to baseline and stable Postop Assessment: no apparent nausea or vomiting Anesthetic complications: no   No notable events documented.  Last Vitals:  Vitals:   05/26/23 1615 05/26/23 1637  BP: 128/87 130/86  Pulse: 64 73  Resp: 15 15  Temp:  36.4 C  SpO2: 99% 99%    Last Pain:  Vitals:   05/26/23 1714  TempSrc:   PainSc: 5                  Mariann Barter

## 2023-05-27 ENCOUNTER — Encounter (HOSPITAL_COMMUNITY): Payer: Self-pay | Admitting: Surgery

## 2023-05-27 DIAGNOSIS — Z79890 Hormone replacement therapy (postmenopausal): Secondary | ICD-10-CM | POA: Diagnosis not present

## 2023-05-27 DIAGNOSIS — E042 Nontoxic multinodular goiter: Secondary | ICD-10-CM | POA: Diagnosis not present

## 2023-05-27 DIAGNOSIS — R053 Chronic cough: Secondary | ICD-10-CM | POA: Diagnosis not present

## 2023-05-27 DIAGNOSIS — E063 Autoimmune thyroiditis: Secondary | ICD-10-CM | POA: Diagnosis not present

## 2023-05-27 LAB — CALCIUM: Calcium: 8.7 mg/dL — ABNORMAL LOW (ref 8.9–10.3)

## 2023-05-27 MED ORDER — CALCIUM CARBONATE ANTACID 500 MG PO CHEW: 2.0000 | CHEWABLE_TABLET | Freq: Two times a day (BID) | ORAL | 1 refills | Status: AC

## 2023-05-27 MED ORDER — LEVOTHYROXINE SODIUM 100 MCG PO TABS: 100.0000 ug | ORAL_TABLET | Freq: Every day | ORAL | 3 refills | Status: AC

## 2023-05-27 MED ORDER — SUMATRIPTAN SUCCINATE 50 MG PO TABS
100.0000 mg | ORAL_TABLET | ORAL | Status: DC | PRN
  Administered 2023-05-27: 100 mg via ORAL
  Filled 2023-05-27 (×2): qty 2

## 2023-05-27 MED ORDER — OXYCODONE HCL 5 MG PO TABS: 5.0000 mg | ORAL_TABLET | Freq: Four times a day (QID) | ORAL | 0 refills | Status: AC | PRN

## 2023-05-27 NOTE — Plan of Care (Signed)

## 2023-05-27 NOTE — Discharge Instructions (Signed)
[facility] - Dr. Darnell Level  THYROID & PARATHYROID SURGERY:  POST-OP INSTRUCTIONS  Always review the instruction sheet provided by the hospital nurse at discharge.  A prescription for pain medication may be sent to your pharmacy at the time of discharge.  Take your pain medication as prescribed.  If narcotic pain medicine is not needed, then you may take acetaminophen (Tylenol) or ibuprofen (Advil) as needed for pain or soreness.  Take your normal home medications as prescribed unless otherwise directed.  If you need a refill on your pain medication, please contact the office during regular business hours.  Prescriptions will not be processed by the office after 5:00PM or on weekends.  Start with a light diet upon arrival home, such as soup and crackers or toast.  Be sure to drink plenty of fluids.  Resume your normal diet the day after surgery.  Most patients will experience some swelling and bruising on the chest and neck area.  Ice packs will help for the first 48 hours after arriving home.  Swelling and bruising will take several days to resolve.   It is common to experience some constipation after surgery.  Increasing fluid intake and taking a stool softener (Colace) will usually help to prevent this problem.  A mild laxative (Milk of Magnesia or Miralax) should be taken according to package directions if there has been no bowel movement after 48 hours.  Dermabond glue covers your incision. This seals the wound and you may shower at any time. The Dermabond will remain in place for about a week.  You may gradually remove the glue when it loosens around the edges.  If you need to loosen the Dermabond for removal, apply a layer of Vaseline to the wound for 15 minutes and then remove with a Kleenex. Your sutures are under the skin and will not show - they will dissolve on their own.  You may resume light daily activities beginning the day after discharge (such as self-care,  walking, climbing stairs), gradually increasing activities as tolerated. You may have sexual intercourse when it is comfortable. Refrain from any heavy lifting or straining until approved by your doctor. You may drive when you no longer are taking prescription pain medication, you can comfortably wear a seatbelt, and you can safely maneuver your car and apply the brakes.  You will see your doctor in the office for a follow-up appointment approximately three weeks after your surgery.  Make sure that you call for this appointment within a day or two after you arrive home to insure a convenient appointment time. Please have any requested laboratory tests performed a few days prior to your office visit so that the results will be available at your follow up appointment.  WHEN TO CALL THE [facility] OFFICE: -- Fever greater than 101.5 -- Inability to urinate -- Nausea and/or vomiting - persistent -- Extreme swelling or bruising -- Continued bleeding from incision -- Increased pain, redness, or drainage from the incision -- Difficulty swallowing or breathing -- Muscle cramping or spasms -- Numbness or tingling in hands or around lips  The clinic staff is available to answer your questions during regular business hours.  Please don't hesitate to call and ask to speak to one of the nurses if you have concerns.  [facility] OFFICE: [phone] (24 hours)  Please sign up for MyChart accounts. This will allow you to communicate directly with my nurse or myself without having to call the office. It will also allow you  to view your test results. You will need to enroll in MyChart for my office ([facility]) and for the hospital [facility]).  Darnell Level, MD [facility] A [facility] practice

## 2023-05-27 NOTE — Progress Notes (Signed)
    Assessment & Plan: POD#1 - status post total thyroidectomy  Migraine this morning with nausea - Rx started  Calcium 8.7 mg/dl  Anticipate discharge this afternoon        Darnell Level, MD [facility] practice Office: [phone]        Chief Complaint: Hashimoto's thyroiditis, thyroid nodules  Subjective: Patient in bed, complains of headache and nausea.  Family at bedside.  Objective: Vital signs in last 24 hours: Temp:  [97.6 F (36.4 C)-98.9 F (37.2 C)] 98 F (36.7 C) (01/28 0333) Pulse Rate:  [53-81] 69 (01/28 0333) Resp:  [7-20] 16 (01/28 0333) BP: (120-152)/(80-100) 129/80 (01/28 0333) SpO2:  [93 %-100 %] 96 % (01/28 0333) Weight:  [098 kg] 107 kg (01/27 1100) Last BM Date : 05/26/23  Intake/Output from previous day: 01/27 0701 - 01/28 0700 In: 1931.8 [P.O.:420; I.V.:1311.8; IV Piggyback:200] Out: 880 [Urine:850; Blood:30] Intake/Output this shift: No intake/output data recorded.  Physical Exam: HEENT - sclerae clear, mucous membranes moist Neck - wound dry and intact; mild STS; voice normal  Lab Results:  No results for input(s): "WBC", "HGB", "HCT", "PLT" in the last 72 hours. BMET Recent Labs    05/27/23 0505  CALCIUM 8.7*   PT/INR No results for input(s): "LABPROT", "INR" in the last 72 hours. Comprehensive Metabolic Panel:    Component Value Date/Time   NA 137 05/21/2023 1354   NA 138 02/16/2022 1612   K 4.2 05/21/2023 1354   K 4.1 02/16/2022 1612   CL 106 05/21/2023 1354   CL 104 02/16/2022 1612   CO2 24 05/21/2023 1354   CO2 26 02/16/2022 1612   BUN 11 05/21/2023 1354   BUN 15 02/16/2022 1612   CREATININE 0.46 05/21/2023 1354   CREATININE 0.75 02/16/2022 1612   CREATININE 0.69 06/07/2015 1641   CREATININE 0.84 06/01/2015 1605   GLUCOSE 96 05/21/2023 1354   GLUCOSE 157 (H) 02/16/2022 1612   CALCIUM 8.7 (L) 05/27/2023 0505   CALCIUM 9.1 05/21/2023 1354   AST 16 09/24/2020 1749   AST 22 06/07/2015 1641    ALT 10 09/24/2020 1749   ALT 37 (H) 06/07/2015 1641   ALKPHOS 32 (L) 09/24/2020 1749   ALKPHOS 43 06/07/2015 1641   BILITOT 0.6 09/24/2020 1749   BILITOT 0.4 06/07/2015 1641   PROT 6.6 09/24/2020 1749   PROT 6.5 06/07/2015 1641   ALBUMIN 4.2 09/24/2020 1749   ALBUMIN 4.3 06/07/2015 1641    Studies/Results: No results found.    Darnell Level 05/27/2023  Patient ID: Aldean Jewett, female   DOB: 1975-09-22, 48 y.o.   MRN: [id number]

## 2023-05-27 NOTE — Progress Notes (Signed)
PIV removed. AVS reviewed with patient and husband. Both verbalize understanding of medication regimen and necessary follow up appointments

## 2023-05-27 NOTE — Discharge Summary (Signed)
    Physician Discharge Summary   Patient ID: Robin Stout MRN: [id number] DOB/AGE: 12/06/1975 48 y.o.  Admit date: 05/26/2023  Discharge date: 05/27/2023  Discharge Diagnoses:  Principal Problem:   Multiple thyroid nodules Active Problems:   Hashimoto's thyroiditis   Discharged Condition: good  Hospital Course: Patient was admitted for observation following thyroid surgery.  Post op course was uncomplicated.  Pain was well controlled.  Tolerated diet.  Post op calcium level on morning following surgery was 8.7 mg/dl.  Patient was prepared for discharge home on POD#1.  Consults: None  Treatments: surgery: total thyroidectomy  Discharge Exam: Blood pressure 137/82, pulse 68, temperature 98.1 F (36.7 C), temperature source Oral, resp. rate 16, height 5\' 2"  (1.575 m), weight 107 kg, SpO2 96%. See progress note  Disposition: Home  Discharge Instructions     Diet - low sodium heart healthy   Complete by: As directed    Increase activity slowly   Complete by: As directed    No dressing needed   Complete by: As directed       Allergies as of 05/27/2023       Reactions   Penicillins Swelling   Inderal [propranolol]    Severe fatigue (patient denies being aware of this allergy)        Medication List     TAKE these medications    Aimovig 140 MG/ML Soaj Generic drug: Erenumab-aooe Inject 140 mg into the skin every 30 (thirty) days.   calcium carbonate 500 MG chewable tablet Commonly known as: Tums Chew 2 tablets (400 mg of elemental calcium total) by mouth 2 (two) times daily.   levothyroxine 100 MCG tablet Commonly known as: Synthroid Take 1 tablet (100 mcg total) by mouth daily before breakfast.   multivitamin with minerals Tabs tablet Take 1 tablet by mouth in the morning.   ondansetron 4 MG tablet Commonly known as: ZOFRAN Take 1 tablet (4 mg total) by mouth every 8 (eight) hours as needed for nausea or vomiting.   oxyCODONE 5 MG immediate  release tablet Commonly known as: Oxy IR/ROXICODONE Take 1 tablet (5 mg total) by mouth every 6 (six) hours as needed for moderate pain (pain score 4-6).   SUMAtriptan 100 MG tablet Commonly known as: IMITREX TAKE 1 TABLET BY MOUTH AS NEEDED FOR MIGRAINE IF HEADACHE PERSIST OR RECURS MAY TAKE 1 TABLET 2 HOURS LATER   triamcinolone cream 0.1 % Commonly known as: KENALOG Apply 1 Application topically 2 (two) times daily as needed (eczema (skin irritation)).               Discharge Care Instructions  (From admission, onward)           Start     Ordered   05/27/23 0000  No dressing needed        05/27/23 1531            Follow-up Information     Darnell Level, MD. Schedule an appointment as soon as possible for a visit in 3 week(s).   Specialty: General Surgery Why: For wound re-check Contact information: [address] [place] [address] [phone]                 Darnell Level, MD [facility] Office: [phone]   Signed: Darnell Level 05/27/2023, 3:31 PM

## 2023-05-28 ENCOUNTER — Encounter: Payer: Self-pay | Admitting: Surgery

## 2023-05-28 LAB — SURGICAL PATHOLOGY

## 2023-05-28 NOTE — Progress Notes (Signed)
Pathology is benign, as expected.  Darnell Level, MD [facility] practice Office: [phone]

## 2023-05-30 DIAGNOSIS — E042 Nontoxic multinodular goiter: Secondary | ICD-10-CM | POA: Diagnosis not present

## 2023-05-30 DIAGNOSIS — E063 Autoimmune thyroiditis: Secondary | ICD-10-CM | POA: Diagnosis not present

## 2023-05-30 DIAGNOSIS — E890 Postprocedural hypothyroidism: Secondary | ICD-10-CM | POA: Diagnosis not present

## 2023-06-02 ENCOUNTER — Telehealth: Payer: Self-pay | Admitting: Surgery

## 2023-06-02 NOTE — Telephone Encounter (Signed)
     Tresa Endo, Patient called this morning.  Having moderate swelling with pressure and trouble swallowing.  Please see if Puja could check in office today, and possibly order USN to see if any significant seroma that could be aspirated. Thanks, tmg  Darnell Level, MD [facility] practice Office: [phone]

## 2023-06-03 ENCOUNTER — Other Ambulatory Visit: Payer: Self-pay | Admitting: Surgery

## 2023-06-03 ENCOUNTER — Encounter: Payer: Self-pay | Admitting: Surgery

## 2023-06-03 DIAGNOSIS — E890 Postprocedural hypothyroidism: Secondary | ICD-10-CM

## 2023-06-04 ENCOUNTER — Ambulatory Visit (HOSPITAL_COMMUNITY)
Admission: RE | Admit: 2023-06-04 | Discharge: 2023-06-04 | Disposition: A | Discharge status: Home / Self Care | Payer: BC Managed Care – PPO | Source: Ambulatory Visit | Attending: Surgery | Admitting: Surgery

## 2023-06-04 ENCOUNTER — Encounter: Payer: Self-pay | Admitting: Surgery

## 2023-06-04 ENCOUNTER — Encounter (HOSPITAL_COMMUNITY): Payer: Self-pay

## 2023-06-04 DIAGNOSIS — E890 Postprocedural hypothyroidism: Secondary | ICD-10-CM

## 2023-06-04 DIAGNOSIS — R221 Localized swelling, mass and lump, neck: Secondary | ICD-10-CM | POA: Diagnosis not present

## 2023-06-04 DIAGNOSIS — S1093XA Contusion of unspecified part of neck, initial encounter: Secondary | ICD-10-CM | POA: Diagnosis not present

## 2023-06-04 DIAGNOSIS — X58XXXA Exposure to other specified factors, initial encounter: Secondary | ICD-10-CM | POA: Insufficient documentation

## 2023-06-04 DIAGNOSIS — T8149XA Infection following a procedure, other surgical site, initial encounter: Secondary | ICD-10-CM | POA: Diagnosis not present

## 2023-06-04 MED ORDER — LIDOCAINE HCL (PF) 2 % IJ SOLN
INTRAMUSCULAR | Status: AC
  Filled 2023-06-04: qty 10

## 2023-06-04 MED ORDER — LIDOCAINE HCL (PF) 2 % IJ SOLN
10.0000 mL | Freq: Once | INTRAMUSCULAR | Status: AC
  Administered 2023-06-04: 10 mL

## 2023-06-04 NOTE — Progress Notes (Signed)
 USN findings are relatively normal for this time period after thyroid  surgery.  No need for further intervention.  Continue ice packs, elevation, anti-inflammatory medication.  Will check in office as scheduled.  Krystal Spinner, MD [facility] practice Office: [phone]

## 2023-06-04 NOTE — Procedures (Signed)
 Interventional Radiology Procedure:   Indications: Post operative complex fluid collections in neck  Procedure: US  guided aspiration of neck fluid collections  Findings: 9 ml of serosanguineous fluid removed from complex collections in post operative neck  Complications: None     EBL: Minimal  Plan: Discharge to home.   Shavonne Ambroise R. Philip, MD  Pager: [phone]

## 2023-06-06 DIAGNOSIS — E063 Autoimmune thyroiditis: Secondary | ICD-10-CM | POA: Diagnosis not present

## 2023-06-06 DIAGNOSIS — E042 Nontoxic multinodular goiter: Secondary | ICD-10-CM | POA: Diagnosis not present

## 2023-06-10 DIAGNOSIS — M79671 Pain in right foot: Secondary | ICD-10-CM | POA: Insufficient documentation

## 2023-06-10 DIAGNOSIS — M79672 Pain in left foot: Secondary | ICD-10-CM | POA: Diagnosis not present

## 2023-06-16 DIAGNOSIS — E063 Autoimmune thyroiditis: Secondary | ICD-10-CM | POA: Diagnosis not present

## 2023-06-16 DIAGNOSIS — E890 Postprocedural hypothyroidism: Secondary | ICD-10-CM | POA: Diagnosis not present

## 2023-06-16 DIAGNOSIS — E042 Nontoxic multinodular goiter: Secondary | ICD-10-CM | POA: Diagnosis not present

## 2023-06-17 LAB — LAB REPORT - SCANNED: Free T4: 0.89 ng/dL

## 2023-06-24 DIAGNOSIS — Z9889 Other specified postprocedural states: Secondary | ICD-10-CM | POA: Insufficient documentation

## 2023-06-30 DIAGNOSIS — M722 Plantar fascial fibromatosis: Secondary | ICD-10-CM | POA: Diagnosis not present

## 2023-06-30 DIAGNOSIS — M25571 Pain in right ankle and joints of right foot: Secondary | ICD-10-CM | POA: Insufficient documentation

## 2023-07-04 DIAGNOSIS — G7249 Other inflammatory and immune myopathies, not elsewhere classified: Secondary | ICD-10-CM | POA: Diagnosis not present

## 2023-07-18 DIAGNOSIS — E063 Autoimmune thyroiditis: Secondary | ICD-10-CM | POA: Diagnosis not present

## 2023-07-19 LAB — LAB REPORT - SCANNED
Free T4: 1.14 ng/dL
TSH: 10.9 — AB (ref 0.41–5.90)

## 2023-07-22 DIAGNOSIS — M25571 Pain in right ankle and joints of right foot: Secondary | ICD-10-CM | POA: Diagnosis not present

## 2023-07-24 DIAGNOSIS — M2550 Pain in unspecified joint: Secondary | ICD-10-CM | POA: Diagnosis not present

## 2023-07-24 DIAGNOSIS — M549 Dorsalgia, unspecified: Secondary | ICD-10-CM | POA: Diagnosis not present

## 2023-07-24 DIAGNOSIS — M1990 Unspecified osteoarthritis, unspecified site: Secondary | ICD-10-CM | POA: Diagnosis not present

## 2023-07-24 DIAGNOSIS — M79642 Pain in left hand: Secondary | ICD-10-CM | POA: Diagnosis not present

## 2023-07-24 DIAGNOSIS — M79671 Pain in right foot: Secondary | ICD-10-CM | POA: Diagnosis not present

## 2023-07-24 DIAGNOSIS — M79672 Pain in left foot: Secondary | ICD-10-CM | POA: Diagnosis not present

## 2023-07-24 DIAGNOSIS — R768 Other specified abnormal immunological findings in serum: Secondary | ICD-10-CM | POA: Diagnosis not present

## 2023-07-24 DIAGNOSIS — Z1589 Genetic susceptibility to other disease: Secondary | ICD-10-CM | POA: Diagnosis not present

## 2023-07-24 DIAGNOSIS — M79641 Pain in right hand: Secondary | ICD-10-CM | POA: Diagnosis not present

## 2023-07-25 ENCOUNTER — Other Ambulatory Visit: Payer: Self-pay | Admitting: Rheumatology

## 2023-07-25 DIAGNOSIS — R202 Paresthesia of skin: Secondary | ICD-10-CM

## 2023-07-28 DIAGNOSIS — M722 Plantar fascial fibromatosis: Secondary | ICD-10-CM | POA: Diagnosis not present

## 2023-08-08 DIAGNOSIS — M25572 Pain in left ankle and joints of left foot: Secondary | ICD-10-CM | POA: Insufficient documentation

## 2023-08-08 DIAGNOSIS — M7672 Peroneal tendinitis, left leg: Secondary | ICD-10-CM | POA: Diagnosis not present

## 2023-08-12 ENCOUNTER — Ambulatory Visit
Admission: RE | Admit: 2023-08-12 | Discharge: 2023-08-12 | Disposition: A | Discharge status: Home / Self Care | Source: Ambulatory Visit | Attending: Rheumatology | Admitting: Rheumatology

## 2023-08-12 DIAGNOSIS — R9082 White matter disease, unspecified: Secondary | ICD-10-CM | POA: Diagnosis not present

## 2023-08-12 DIAGNOSIS — R202 Paresthesia of skin: Secondary | ICD-10-CM | POA: Diagnosis not present

## 2023-08-12 DIAGNOSIS — H539 Unspecified visual disturbance: Secondary | ICD-10-CM | POA: Diagnosis not present

## 2023-08-12 MED ORDER — GADOPICLENOL 0.5 MMOL/ML IV SOLN
10.0000 mL | Freq: Once | INTRAVENOUS | Status: AC | PRN
  Administered 2023-08-12: 10 mL via INTRAVENOUS

## 2023-08-13 DIAGNOSIS — M25571 Pain in right ankle and joints of right foot: Secondary | ICD-10-CM | POA: Diagnosis not present

## 2023-08-13 DIAGNOSIS — M722 Plantar fascial fibromatosis: Secondary | ICD-10-CM | POA: Diagnosis not present

## 2023-08-20 DIAGNOSIS — M722 Plantar fascial fibromatosis: Secondary | ICD-10-CM | POA: Diagnosis not present

## 2023-08-20 DIAGNOSIS — M25571 Pain in right ankle and joints of right foot: Secondary | ICD-10-CM | POA: Diagnosis not present

## 2023-09-01 DIAGNOSIS — E890 Postprocedural hypothyroidism: Secondary | ICD-10-CM | POA: Diagnosis not present

## 2023-09-04 DIAGNOSIS — M722 Plantar fascial fibromatosis: Secondary | ICD-10-CM | POA: Diagnosis not present

## 2023-09-04 DIAGNOSIS — M25571 Pain in right ankle and joints of right foot: Secondary | ICD-10-CM | POA: Diagnosis not present

## 2023-09-08 DIAGNOSIS — M722 Plantar fascial fibromatosis: Secondary | ICD-10-CM | POA: Diagnosis not present

## 2023-09-11 DIAGNOSIS — M2550 Pain in unspecified joint: Secondary | ICD-10-CM | POA: Diagnosis not present

## 2023-09-11 DIAGNOSIS — L409 Psoriasis, unspecified: Secondary | ICD-10-CM | POA: Diagnosis not present

## 2023-09-11 DIAGNOSIS — Z79899 Other long term (current) drug therapy: Secondary | ICD-10-CM | POA: Diagnosis not present

## 2023-09-11 DIAGNOSIS — R768 Other specified abnormal immunological findings in serum: Secondary | ICD-10-CM | POA: Diagnosis not present

## 2023-10-07 ENCOUNTER — Encounter: Payer: Self-pay | Admitting: Neurology

## 2023-10-07 ENCOUNTER — Ambulatory Visit: Admitting: Neurology

## 2023-10-07 VITALS — BP 136/84 | HR 68 | Ht 62.0 in | Wt 269.5 lb

## 2023-10-07 DIAGNOSIS — R202 Paresthesia of skin: Secondary | ICD-10-CM

## 2023-10-07 DIAGNOSIS — E890 Postprocedural hypothyroidism: Secondary | ICD-10-CM | POA: Insufficient documentation

## 2023-10-07 DIAGNOSIS — G939 Disorder of brain, unspecified: Secondary | ICD-10-CM | POA: Insufficient documentation

## 2023-10-07 DIAGNOSIS — E569 Vitamin deficiency, unspecified: Secondary | ICD-10-CM

## 2023-10-07 DIAGNOSIS — R9082 White matter disease, unspecified: Secondary | ICD-10-CM | POA: Diagnosis not present

## 2023-10-07 DIAGNOSIS — M1990 Unspecified osteoarthritis, unspecified site: Secondary | ICD-10-CM | POA: Insufficient documentation

## 2023-10-07 DIAGNOSIS — R209 Unspecified disturbances of skin sensation: Secondary | ICD-10-CM | POA: Insufficient documentation

## 2023-10-07 DIAGNOSIS — E042 Nontoxic multinodular goiter: Secondary | ICD-10-CM | POA: Insufficient documentation

## 2023-10-07 DIAGNOSIS — E559 Vitamin D deficiency, unspecified: Secondary | ICD-10-CM | POA: Insufficient documentation

## 2023-10-07 DIAGNOSIS — M1380 Other specified arthritis, unspecified site: Secondary | ICD-10-CM | POA: Insufficient documentation

## 2023-10-07 DIAGNOSIS — L409 Psoriasis, unspecified: Secondary | ICD-10-CM | POA: Insufficient documentation

## 2023-10-07 DIAGNOSIS — R200 Anesthesia of skin: Secondary | ICD-10-CM | POA: Diagnosis not present

## 2023-10-07 DIAGNOSIS — M47819 Spondylosis without myelopathy or radiculopathy, site unspecified: Secondary | ICD-10-CM | POA: Insufficient documentation

## 2023-10-07 DIAGNOSIS — R260 Ataxic gait: Secondary | ICD-10-CM | POA: Diagnosis not present

## 2023-10-07 MED ORDER — ALPRAZOLAM 0.5 MG PO TABS: ORAL_TABLET | ORAL | 0 refills | Status: AC

## 2023-10-07 NOTE — Progress Notes (Signed)
 [facility]  PATIENT: Ramonda Galyon DOB: 12-Dec-1975  REFERRING DOCTOR OR PCP: Delfin Fell, NP; Denman Fischer, MD; Dr. Bascom Lily (Rheum) SOURCE: Patient, notes from primary care and endocrinology, imaging and lab reports, MRI images personally reviewed and summarized below  _________________________________   HISTORICAL  CHIEF COMPLAINT:  Chief Complaint  Patient presents with   RM10/Tingling    Pt is here with her Husband. Pt states her she had her thyroid  removed in January  and since then her symptoms started. Pt states that she has been having a lot of pain in her right foot and she was unable to put weight on it. Pt states that she had lab work done and her lab work came back positive for autoimmune diseases. Pt states that she has zapping pain. Pt states she has vision issues like blurry vision and double vision. Pt states that she has muscle spasms and jerking in her arms and legs.   cont..    Pt states that her legs feel heavy. Pt states that she has extreme fatigue. Pt states that she has trouble with word finding. Pt states that her words will sometimes come out a gibberish. Pt states that her mom had MS. Pt states that she has gotten a MRI done in April.      HISTORY OF PRESENT ILLNESS:  I had the pleasure of seeing your patient, Katrinia Straker, at [facility] for neurologic consultation regarding her right foot pain, spasms, word finding difficulty and fatigue   She is a 48 year old woman who began to experience difficulties wit her feet in 2024.   She was diagnosed with Hashimoto's thyroiditis and had a thyroidectomy in January 2025.   She was bedridden a few days.   Her right foot ws actually hurting more so she saw orthopedics.    She had inflammatory labs performed and she was ANA positive and HLA B27 positive.  She was referred to rheumatology (Dr. Bascom Lily).  She was diagnosed with either ankylosing spondylosis or psoriatic  arthritis.  She has noted intermittent blurriness in the right eye and some diplopia.   She also has numbness and tingling in the left arm and both feet (R>L).   The left arm feels it is always asleep.    The sensation will not 'shake out'.   She gets electric like pain in her ribs, back, fingers and thighs - at different times.    She notes her legs or arms will sometimes jerk involuntarily.  Legs feel heavy.  She has had recurrent UTI's.   She has urinary urgency and also notes mild hesitancy and has double voids frequently.     She has noted some word finding errors and feels that she sometimes says gibberish.     She saw rheumatology and was diagnosed with ankylosing spondylosis and psoriatic arthritis.  AS an anti-TNF agent was being considered, she had a brain MRI showing some non-specific white matter spots.  In t  Vascular risks: She has migraine headaches.  She does not have hypertension, diabetes.  She was never a smoker  Her mother had MS.   Imaging:  MRI of the brain 08/12/2023 was performed and compared to 1 performed 7 years earlier in 2018.  There are a small number of scattered T2/FLAIR hyperintense foci.  These are small rounded located in the subcortical white matter.  This has progressed compared to the 2018 MRI when there was only a single focus.  There is no atrophy.  The cervicomedullary junction appears normal.  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   Labs: Recent labs showed elevated TPO.  She is HLA-B27 positive.  ESR/CRP were normal.  ANA was elevated.   REVIEW OF SYSTEMS: Constitutional: No fevers, chills, sweats, or change in appetite Eyes: No visual changes, double vision, eye pain Ear, nose and throat: No hearing loss, ear pain, nasal congestion, sore throat Cardiovascular: No chest pain, palpitations Respiratory:  No shortness of breath at rest or with exertion.   No wheezes GastrointestinaI: No nausea, vomiting,  diarrhea, abdominal pain, fecal incontinence Genitourinary:  No dysuria, urinary retention or frequency.  No nocturia. Musculoskeletal: She reports back and hip pain.   Integumentary: She has had some skin lesions off and on.  Currently with a sunburn Neurological: as above Psychiatric: No depression at this time.  No anxiety Endocrine: No palpitations, diaphoresis, change in appetite, change in weigh or increased thirst Hematologic/Lymphatic:  No anemia, purpura, petechiae. Allergic/Immunologic: No itchy/runny eyes, nasal congestion, recent allergic reactions, rashes  ALLERGIES: Allergies  Allergen Reactions   Penicillins Swelling   Inderal  [Propranolol ]     Severe fatigue (patient denies being aware of this allergy)    HOME MEDICATIONS:  Current Outpatient Medications:    AIMOVIG  140 MG/ML SOAJ, Inject 140 mg into the skin every 30 (thirty) days., Disp: , Rfl:    ergocalciferol (VITAMIN D2) 1.25 MG (50000 UT) capsule, Take 50,000 Units by mouth once a week., Disp: , Rfl:    folic acid  (FOLVITE ) 1 MG tablet, Take 1 tablet by mouth daily., Disp: , Rfl:    levothyroxine  (SYNTHROID ) 100 MCG tablet, Take 1 tablet (100 mcg total) by mouth daily before breakfast. (Patient taking differently: Take 150 mcg by mouth daily before breakfast.), Disp: 30 tablet, Rfl: 3   methotrexate (RHEUMATREX) 2.5 MG tablet, Take 2.5 mg by mouth once a week., Disp: , Rfl:    ondansetron  (ZOFRAN ) 4 MG tablet, Take 1 tablet (4 mg total) by mouth every 8 (eight) hours as needed for nausea or vomiting., Disp: 12 tablet, Rfl: 0   SUMAtriptan  (IMITREX ) 100 MG tablet, TAKE 1 TABLET BY MOUTH AS NEEDED FOR MIGRAINE IF HEADACHE PERSIST OR RECURS MAY TAKE 1 TABLET 2 HOURS LATER, Disp: 10 tablet, Rfl: 5   calcium  carbonate (TUMS) 500 MG chewable tablet, Chew 2 tablets (400 mg of elemental calcium  total) by mouth 2 (two) times daily. (Patient not taking: Reported on 10/07/2023), Disp: 90 tablet, Rfl: 1   Multiple Vitamin  (MULTIVITAMIN WITH MINERALS) TABS tablet, Take 1 tablet by mouth in the morning. (Patient not taking: Reported on 10/07/2023), Disp: , Rfl:    oxyCODONE  (OXY IR/ROXICODONE ) 5 MG immediate release tablet, Take 1 tablet (5 mg total) by mouth every 6 (six) hours as needed for moderate pain (pain score 4-6). (Patient not taking: Reported on 10/07/2023), Disp: 15 tablet, Rfl: 0   triamcinolone  cream (KENALOG ) 0.1 %, Apply 1 Application topically 2 (two) times daily as needed (eczema (skin irritation)). (Patient not taking: Reported on 10/07/2023), Disp: , Rfl:   PAST MEDICAL HISTORY: Past Medical History:  Diagnosis Date   Arthritis    Complication of anesthesia    Hashimoto's disease    Heart murmur    Migraines    Mixed hyperlipidemia    PONV (postoperative nausea and vomiting)    Recurrent UTI     PAST SURGICAL HISTORY: Past Surgical History:  Procedure Laterality Date   ABDOMINAL HYSTERECTOMY  ABDOMINOPLASTY  2016   APPENDECTOMY     CESAREAN SECTION     THYROIDECTOMY N/A 05/26/2023   Procedure: TOTAL THYROIDECTOMY;  Surgeon: Oralee Billow, MD;  Location: [facility];  Service: General;  Laterality: N/A;    FAMILY HISTORY: Family History  Problem Relation Age of Onset   Multiple sclerosis Mother    Diabetes Father    Heart disease Father    Hypertension Father    Stroke Father     SOCIAL HISTORY: Social History   Socioeconomic History   Marital status: Married    Spouse name: Not on file   Number of children: Not on file   Years of education: Not on file   Highest education level: Not on file  Occupational History   Not on file  Tobacco Use   Smoking status: Never   Smokeless tobacco: Never  Vaping Use   Vaping status: Never Used  Substance and Sexual Activity   Alcohol use: Yes    Comment: Occasional   Drug use: No   Sexual activity: Not on file  Other Topics Concern   Not on file  Social History Narrative   Not on file   Social Drivers of Health   Financial  Resource Strain: Not on file  Food Insecurity: No Food Insecurity (05/26/2023)   Hunger Vital Sign    Worried About Running Out of Food in the Last Year: Never true    Ran Out of Food in the Last Year: Never true  Transportation Needs: No Transportation Needs (05/26/2023)   PRAPARE - Administrator, Civil Service (Medical): No    Lack of Transportation (Non-Medical): No  Physical Activity: Not on file  Stress: Not on file  Social Connections: Not on file  Intimate Partner Violence: Not At Risk (05/26/2023)   Humiliation, Afraid, Rape, and Kick questionnaire    Fear of Current or Ex-Partner: No    Emotionally Abused: No    Physically Abused: No    Sexually Abused: No       PHYSICAL EXAM  Vitals:   10/07/23 0824  BP: 136/84  Pulse: 68  Weight: 269 lb 8 oz (122.2 kg)  Height: 5\' 2"  (1.575 m)    Body mass index is 49.29 kg/m.   General: The patient is well-developed and well-nourished and in no acute distress  HEENT:  Head is [place]/AT.  Sclera are anicteric.     Neck: No carotid bruits are noted.  The neck is nontender.  Cardiovascular: The heart has a regular rate and rhythm with a normal S1 and S2. There were no murmurs, gallops or rubs.    Skin: Extremities are without rash.  She has some peeling from a recent sunburn.  Musculoskeletal:  Back is nontender  Neurologic Exam  Mental status: The patient is alert and oriented x 3 at the time of the examination. The patient has apparent normal recent and remote memory, with an apparently normal attention span and concentration ability.   Speech is normal.  Cranial nerves: Extraocular movements are full. Pupils are equal, round, and reactive to light and accomodation.  Visual fields are full.  Facial symmetry is present. There is good facial sensation to soft touch bilaterally.Facial strength is normal.  Trapezius and sternocleidomastoid strength is normal. No dysarthria is noted.  The tongue is midline, and the  patient has symmetric elevation of the soft palate. No obvious hearing deficits are noted.  Motor:  Muscle bulk is normal.   Tone is normal.  Strength is  5 / 5 in all 4 extremities.    Som giveway reduced strength left APB, +/- reduced left hand RAM  Sensory: She reports reduced sensation to touch in the left arm relative to the right.  Vibration was more symmetric.    Coordination: Cerebellar testing reveals good finger-nose-finger and heel-to-shin bilaterally.  Mildly reduced left finger-nose-finger  Gait and station: Station is normal.   Gait is normal. Tandem gait is slightly wide. Romberg is borderline.   Reflexes: Deep tendon reflexes are symmetric and normal bilaterally.   Plantar responses are flexor.    DIAGNOSTIC DATA (LABS, IMAGING, TESTING) - I reviewed patient records, labs, notes, testing and imaging myself where available.  Lab Results  Component Value Date   WBC 5.4 05/21/2023   HGB 13.6 05/21/2023   HCT 42.6 05/21/2023   MCV 84.9 05/21/2023   PLT 228 05/21/2023      Component Value Date/Time   NA 137 05/21/2023 1354   K 4.2 05/21/2023 1354   CL 106 05/21/2023 1354   CO2 24 05/21/2023 1354   GLUCOSE 96 05/21/2023 1354   BUN 11 05/21/2023 1354   CREATININE 0.46 05/21/2023 1354   CREATININE 0.69 06/07/2015 1641   CALCIUM  8.7 (L) 05/27/2023 0505   PROT 6.6 09/24/2020 1749   ALBUMIN  4.2 09/24/2020 1749   AST 16 09/24/2020 1749   ALT 10 09/24/2020 1749   ALKPHOS 32 (L) 09/24/2020 1749   BILITOT 0.6 09/24/2020 1749   GFRNONAA >60 05/21/2023 1354   GFRNONAA 89 06/01/2015 1605   GFRAA >89 06/01/2015 1605    Lab Results  Component Value Date   TSH 1.432 06/01/2015       ASSESSMENT AND PLAN  Numbness and tingling - Plan: MR CERVICAL SPINE W WO CONTRAST, Vitamin B12, Copper, serum  Ataxic gait - Plan: MR CERVICAL SPINE W WO CONTRAST, Vitamin B12, Copper, serum  White matter abnormality on MRI of brain - Plan: MR CERVICAL SPINE W WO CONTRAST  Vitamin  deficiency - Plan: Vitamin B12, Copper, serum   In summary, Ms. Jarecki is a 48 year old woman recently diagnosed with ankylosing spondylosis/psoriatic arthritis with multiple symptoms including left arm numbness, leg heaviness, gait disturbance and word finding difficulty.  Recent MRI of the brain showed a few nonspecific white matter foci that would be much more consistent with either mild chronic microvascular ischemic change or sequela of migraine headache than demyelination.     Due to the nature of her symptoms, we need to check an MRI of the cervical spine to determine if there is an intrinsic or extrinsic myelitis/myelopathy that could be explaining some of her symptoms.  If that does not show any evidence of demyelination, she would be cleared to proceed with an anti-TNF agent if needed for her rheumatologic issues.  We did discuss that if that study is normal or near normal and she continues to have the symptoms in the left arm that I would want to check an NCV/EMG.  Additionally we will check B12 and copper to make sure a vitamin/mineral deficiency is not playing a significant role in her symptoms.  She will return to see me in 3 months or sooner if there are new or worsening neurologic symptoms.   Kelee Cunningham A. Godwin Lat, MD, [facility] 10/07/2023, 8:36 AM Certified in Neurology, Clinical Neurophysiology, Sleep Medicine and Neuroimaging  [facility] [address] [place], [place] [address] [phone]

## 2023-10-09 ENCOUNTER — Ambulatory Visit: Payer: Self-pay | Admitting: Neurology

## 2023-10-09 LAB — COPPER, SERUM: Copper: 130 ug/dL (ref 80–158)

## 2023-10-09 LAB — VITAMIN B12: Vitamin B-12: 325 pg/mL (ref 232–1245)

## 2023-10-13 DIAGNOSIS — E063 Autoimmune thyroiditis: Secondary | ICD-10-CM | POA: Diagnosis not present

## 2023-10-13 DIAGNOSIS — N390 Urinary tract infection, site not specified: Secondary | ICD-10-CM | POA: Diagnosis not present

## 2023-10-13 DIAGNOSIS — L4050 Arthropathic psoriasis, unspecified: Secondary | ICD-10-CM | POA: Diagnosis not present

## 2023-10-13 DIAGNOSIS — R202 Paresthesia of skin: Secondary | ICD-10-CM | POA: Diagnosis not present

## 2023-10-14 ENCOUNTER — Ambulatory Visit: Admitting: Neurology

## 2023-10-14 ENCOUNTER — Ambulatory Visit (INDEPENDENT_AMBULATORY_CARE_PROVIDER_SITE_OTHER)

## 2023-10-14 DIAGNOSIS — R260 Ataxic gait: Secondary | ICD-10-CM

## 2023-10-14 DIAGNOSIS — R202 Paresthesia of skin: Secondary | ICD-10-CM

## 2023-10-14 DIAGNOSIS — R9082 White matter disease, unspecified: Secondary | ICD-10-CM

## 2023-10-14 DIAGNOSIS — R200 Anesthesia of skin: Secondary | ICD-10-CM

## 2023-10-14 MED ORDER — GADOBENATE DIMEGLUMINE 529 MG/ML IV SOLN
20.0000 mL | Freq: Once | INTRAVENOUS | Status: AC | PRN
Start: 2023-10-14 — End: 2023-10-14
  Administered 2023-10-14: 20 mL via INTRAVENOUS

## 2023-10-16 DIAGNOSIS — Z79899 Other long term (current) drug therapy: Secondary | ICD-10-CM | POA: Diagnosis not present

## 2023-10-16 DIAGNOSIS — M4690 Unspecified inflammatory spondylopathy, site unspecified: Secondary | ICD-10-CM | POA: Diagnosis not present

## 2023-10-16 DIAGNOSIS — L4050 Arthropathic psoriasis, unspecified: Secondary | ICD-10-CM | POA: Diagnosis not present

## 2023-10-16 DIAGNOSIS — L409 Psoriasis, unspecified: Secondary | ICD-10-CM | POA: Diagnosis not present

## 2023-11-03 ENCOUNTER — Ambulatory Visit: Payer: Self-pay | Admitting: Neurology

## 2023-11-06 DIAGNOSIS — Z79899 Other long term (current) drug therapy: Secondary | ICD-10-CM | POA: Diagnosis not present

## 2023-11-26 ENCOUNTER — Other Ambulatory Visit (HOSPITAL_COMMUNITY): Payer: Self-pay | Admitting: Internal Medicine

## 2023-11-26 DIAGNOSIS — E782 Mixed hyperlipidemia: Secondary | ICD-10-CM

## 2023-12-12 ENCOUNTER — Encounter (HOSPITAL_BASED_OUTPATIENT_CLINIC_OR_DEPARTMENT_OTHER): Payer: Self-pay

## 2023-12-12 ENCOUNTER — Other Ambulatory Visit (HOSPITAL_BASED_OUTPATIENT_CLINIC_OR_DEPARTMENT_OTHER)

## 2023-12-30 ENCOUNTER — Ambulatory Visit: Admitting: Neurology

## 2023-12-30 ENCOUNTER — Encounter: Payer: Self-pay | Admitting: Neurology

## 2023-12-30 ENCOUNTER — Telehealth: Payer: Self-pay | Admitting: Neurology

## 2023-12-30 NOTE — Telephone Encounter (Signed)
 Patient cancelled appointment due to feeling sick. Transferred patient to Billing to pay no show of $50

## 2024-01-05 DIAGNOSIS — E890 Postprocedural hypothyroidism: Secondary | ICD-10-CM | POA: Diagnosis not present

## 2024-01-08 ENCOUNTER — Ambulatory Visit (HOSPITAL_BASED_OUTPATIENT_CLINIC_OR_DEPARTMENT_OTHER)
Admission: RE | Admit: 2024-01-08 | Discharge: 2024-01-08 | Disposition: A | Discharge status: Home / Self Care | Payer: Self-pay | Source: Ambulatory Visit | Attending: Internal Medicine | Admitting: Internal Medicine

## 2024-01-08 DIAGNOSIS — E782 Mixed hyperlipidemia: Secondary | ICD-10-CM | POA: Insufficient documentation

## 2024-01-16 DIAGNOSIS — Z23 Encounter for immunization: Secondary | ICD-10-CM | POA: Diagnosis not present

## 2024-01-16 DIAGNOSIS — L4050 Arthropathic psoriasis, unspecified: Secondary | ICD-10-CM | POA: Diagnosis not present

## 2024-01-16 DIAGNOSIS — M4690 Unspecified inflammatory spondylopathy, site unspecified: Secondary | ICD-10-CM | POA: Diagnosis not present

## 2024-01-16 DIAGNOSIS — L409 Psoriasis, unspecified: Secondary | ICD-10-CM | POA: Diagnosis not present

## 2024-01-16 DIAGNOSIS — Z79899 Other long term (current) drug therapy: Secondary | ICD-10-CM | POA: Diagnosis not present

## 2024-01-22 DIAGNOSIS — R509 Fever, unspecified: Secondary | ICD-10-CM | POA: Diagnosis not present

## 2024-01-22 DIAGNOSIS — B349 Viral infection, unspecified: Secondary | ICD-10-CM | POA: Diagnosis not present

## 2024-01-22 DIAGNOSIS — Z20822 Contact with and (suspected) exposure to covid-19: Secondary | ICD-10-CM | POA: Diagnosis not present

## 2024-01-23 DIAGNOSIS — J029 Acute pharyngitis, unspecified: Secondary | ICD-10-CM | POA: Diagnosis not present

## 2024-01-23 DIAGNOSIS — U071 COVID-19: Secondary | ICD-10-CM | POA: Diagnosis not present

## 2024-01-28 DIAGNOSIS — J101 Influenza due to other identified influenza virus with other respiratory manifestations: Secondary | ICD-10-CM | POA: Diagnosis not present

## 2024-01-28 DIAGNOSIS — J069 Acute upper respiratory infection, unspecified: Secondary | ICD-10-CM | POA: Diagnosis not present

## 2024-01-28 DIAGNOSIS — J029 Acute pharyngitis, unspecified: Secondary | ICD-10-CM | POA: Diagnosis not present

## 2024-02-03 ENCOUNTER — Other Ambulatory Visit (HOSPITAL_COMMUNITY): Payer: Self-pay | Admitting: Internal Medicine

## 2024-02-03 DIAGNOSIS — Z1231 Encounter for screening mammogram for malignant neoplasm of breast: Secondary | ICD-10-CM

## 2024-02-09 ENCOUNTER — Ambulatory Visit (HOSPITAL_COMMUNITY)
Admission: RE | Admit: 2024-02-09 | Discharge: 2024-02-09 | Disposition: A | Discharge status: Home / Self Care | Source: Ambulatory Visit | Attending: Internal Medicine | Admitting: Internal Medicine

## 2024-02-09 DIAGNOSIS — Z1231 Encounter for screening mammogram for malignant neoplasm of breast: Secondary | ICD-10-CM | POA: Insufficient documentation

## 2024-02-16 DIAGNOSIS — E782 Mixed hyperlipidemia: Secondary | ICD-10-CM | POA: Diagnosis not present

## 2024-02-16 DIAGNOSIS — E559 Vitamin D deficiency, unspecified: Secondary | ICD-10-CM | POA: Diagnosis not present

## 2024-02-16 DIAGNOSIS — E538 Deficiency of other specified B group vitamins: Secondary | ICD-10-CM | POA: Diagnosis not present

## 2024-02-16 DIAGNOSIS — E063 Autoimmune thyroiditis: Secondary | ICD-10-CM | POA: Diagnosis not present

## 2024-02-23 DIAGNOSIS — L4050 Arthropathic psoriasis, unspecified: Secondary | ICD-10-CM | POA: Diagnosis not present

## 2024-02-23 DIAGNOSIS — H60543 Acute eczematoid otitis externa, bilateral: Secondary | ICD-10-CM | POA: Diagnosis not present

## 2024-02-23 DIAGNOSIS — Z0001 Encounter for general adult medical examination with abnormal findings: Secondary | ICD-10-CM | POA: Diagnosis not present

## 2024-02-23 DIAGNOSIS — R0683 Snoring: Secondary | ICD-10-CM | POA: Diagnosis not present

## 2024-02-23 DIAGNOSIS — F419 Anxiety disorder, unspecified: Secondary | ICD-10-CM | POA: Diagnosis not present

## 2024-04-14 DIAGNOSIS — N3000 Acute cystitis without hematuria: Secondary | ICD-10-CM | POA: Diagnosis not present

## 2024-04-14 DIAGNOSIS — R300 Dysuria: Secondary | ICD-10-CM | POA: Diagnosis not present
